# Patient Record
Sex: Female | Born: 1965 | Race: Black or African American | Hispanic: No | Marital: Single | Smoking: Never smoker
Health system: Southern US, Community
[De-identification: ages and names within clinical notes are randomized; demographics above are authoritative.]

## PROBLEM LIST (undated history)

## (undated) ENCOUNTER — Ambulatory Visit (HOSPITAL_COMMUNITY): Admission: EM | Payer: 59 | Source: Home / Self Care

## (undated) DIAGNOSIS — I10 Essential (primary) hypertension: Secondary | ICD-10-CM

## (undated) DIAGNOSIS — R011 Cardiac murmur, unspecified: Secondary | ICD-10-CM

## (undated) DIAGNOSIS — I4892 Unspecified atrial flutter: Secondary | ICD-10-CM

## (undated) DIAGNOSIS — I4891 Unspecified atrial fibrillation: Secondary | ICD-10-CM

## (undated) HISTORY — DX: Unspecified atrial flutter: I48.92

## (undated) HISTORY — DX: Essential (primary) hypertension: I10

---

## 2001-09-11 ENCOUNTER — Emergency Department (HOSPITAL_COMMUNITY): Admission: EM | Admit: 2001-09-11 | Discharge: 2001-09-11 | Payer: Self-pay

## 2003-06-06 ENCOUNTER — Encounter: Admission: RE | Admit: 2003-06-06 | Discharge: 2003-06-06 | Payer: Self-pay | Admitting: Ophthalmology

## 2003-06-12 ENCOUNTER — Encounter (HOSPITAL_COMMUNITY): Admission: RE | Admit: 2003-06-12 | Discharge: 2003-09-10 | Payer: Self-pay | Admitting: Internal Medicine

## 2005-04-29 ENCOUNTER — Encounter: Admission: RE | Admit: 2005-04-29 | Discharge: 2005-04-29 | Payer: Self-pay | Admitting: Obstetrics and Gynecology

## 2005-05-06 ENCOUNTER — Other Ambulatory Visit: Admission: RE | Admit: 2005-05-06 | Discharge: 2005-05-06 | Payer: Self-pay | Admitting: Obstetrics and Gynecology

## 2006-05-17 ENCOUNTER — Emergency Department (HOSPITAL_COMMUNITY): Admission: EM | Admit: 2006-05-17 | Discharge: 2006-05-17 | Payer: Self-pay | Admitting: *Deleted

## 2009-01-16 ENCOUNTER — Emergency Department (HOSPITAL_COMMUNITY): Admission: EM | Admit: 2009-01-16 | Discharge: 2009-01-16 | Payer: Self-pay | Admitting: Family Medicine

## 2009-01-28 ENCOUNTER — Emergency Department (HOSPITAL_COMMUNITY): Admission: EM | Admit: 2009-01-28 | Discharge: 2009-01-28 | Payer: Self-pay | Admitting: Family Medicine

## 2009-10-14 ENCOUNTER — Emergency Department (HOSPITAL_COMMUNITY): Admission: EM | Admit: 2009-10-14 | Discharge: 2009-10-14 | Payer: Self-pay | Admitting: Emergency Medicine

## 2012-01-14 ENCOUNTER — Encounter (HOSPITAL_COMMUNITY): Payer: Self-pay | Admitting: *Deleted

## 2012-01-14 ENCOUNTER — Emergency Department (INDEPENDENT_AMBULATORY_CARE_PROVIDER_SITE_OTHER)
Admission: EM | Admit: 2012-01-14 | Discharge: 2012-01-14 | Disposition: A | Discharge status: Home / Self Care | Payer: 59 | Source: Home / Self Care | Attending: Family Medicine | Admitting: Family Medicine

## 2012-01-14 DIAGNOSIS — R05 Cough: Secondary | ICD-10-CM

## 2012-01-14 DIAGNOSIS — J069 Acute upper respiratory infection, unspecified: Secondary | ICD-10-CM

## 2012-01-14 DIAGNOSIS — R059 Cough, unspecified: Secondary | ICD-10-CM

## 2012-01-14 LAB — POCT RAPID STREP A: Streptococcus, Group A Screen (Direct): NEGATIVE

## 2012-01-14 MED ORDER — AMOXICILLIN 500 MG PO CAPS: 500.0000 mg | ORAL_CAPSULE | Freq: Three times a day (TID) | ORAL | Status: DC

## 2012-01-14 MED ORDER — IBUPROFEN 600 MG PO TABS: 600.0000 mg | ORAL_TABLET | Freq: Three times a day (TID) | ORAL | Status: DC | PRN

## 2012-01-14 MED ORDER — HYDROCOD POLST-CHLORPHEN POLST 10-8 MG/5ML PO LQCR: 5.0000 mL | Freq: Two times a day (BID) | ORAL | Status: DC | PRN

## 2012-01-14 MED ORDER — BENZONATATE 100 MG PO CAPS: 100.0000 mg | ORAL_CAPSULE | Freq: Three times a day (TID) | ORAL | Status: DC

## 2012-01-14 NOTE — ED Provider Notes (Signed)
History     CSN: [id number]  Arrival date & time 01/14/12  1020   First MD Initiated Contact with Patient 01/14/12 1032      Chief Complaint  Patient presents with  . Sore Throat    (Consider location/radiation/quality/duration/timing/severity/associated sxs/prior treatment) HPI Comments: 46 year old female with no significant past medical history here complaining of sore throat, hoarseness, persistent dry cough and nasal congestion for 3 days. Patient reports upper anterior chest pain discomfort with coughing. She reports that her cough is worst at nighttime and is not associated with sputum. Also denies wheezing or shortness of breath. Denies pleuritic type of chest pain. Patient denies fever, chills or rigors. Reports pain with swallowing but states her appetite is at baseline. Denies abdominal pain or rash. No headache or dizziness. Patient reports that she feels tired from coughing. 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   History reviewed. No pertinent past medical history.  History reviewed. No pertinent past surgical history.  No family history on file.  History  Substance Use Topics  . Smoking status: Never Smoker   . Smokeless tobacco: Not on file  . Alcohol Use: No    OB History    Grav Para Term Preterm Abortions TAB SAB Ect Mult Living                  Review of Systems  Constitutional: Negative for fever, chills, diaphoresis and appetite change.  HENT: Positive for congestion and sore throat. Negative for facial swelling, rhinorrhea, neck pain, neck stiffness and sinus pressure.   Eyes: Negative for discharge.  Respiratory: Positive for cough. Negative for shortness of breath and wheezing.   Cardiovascular: Negative for leg swelling.  Gastrointestinal: Negative for nausea, vomiting, abdominal pain and diarrhea.  Musculoskeletal: Negative for myalgias, back pain, joint swelling and arthralgias.  Skin: Negative for rash.  Neurological: Negative for  dizziness and headaches.    Allergies  Review of patient's allergies indicates no known allergies.  Home Medications   Current Outpatient Rx  Name Route Sig Dispense Refill  . AMOXICILLIN 500 MG PO CAPS Oral Take 1 capsule (500 mg total) by mouth 3 (three) times daily. Hold prescription: Only fill if new onset of fever or worsening symptoms after 48 hours. 21 capsule 0  . BENZONATATE 100 MG PO CAPS Oral Take 1 capsule (100 mg total) by mouth every 8 (eight) hours. 21 capsule 0  . HYDROCOD POLST-CPM POLST ER 10-8 MG/5ML PO LQCR Oral Take 5 mLs by mouth every 12 (twelve) hours as needed. 115 mL 0  . IBUPROFEN 600 MG PO TABS Oral Take 1 tablet (600 mg total) by mouth every 8 (eight) hours as needed for pain or fever. 20 tablet 0    BP 147/88  Pulse 96  Temp 98 F (36.7 C) (Oral)  Resp 20  SpO2 100%  LMP 01/12/2012  Physical Exam  Nursing note and vitals reviewed. Constitutional: She is oriented to person, place, and time. She appears well-developed and well-nourished. No distress.  HENT:  Head: Normocephalic and atraumatic.       Nasal Congestion with erythema and swelling of nasal turbinates, no rhinorrhea. Pharyngeal erythema no exudates. No uvula deviation. No trismus. TM's normal.   Eyes: Conjunctivae normal are normal. Right eye exhibits no discharge. Left eye exhibits no discharge.  Neck: Neck supple. No JVD present. No thyromegaly present.  Cardiovascular: Normal rate, regular rhythm and normal heart sounds.   No murmur heard. Pulmonary/Chest: Effort normal and breath sounds normal. No  respiratory distress. She has no wheezes. She has no rales. She exhibits no tenderness.  Lymphadenopathy:    She has no cervical adenopathy.  Neurological: She is alert and oriented to person, place, and time.  Skin: No rash noted. She is not diaphoretic.    ED Course  Procedures (including critical care time)   Labs Reviewed  POCT RAPID STREP A ([facility] ONLY)   No results  found.   1. URI (upper respiratory infection)   2. Cough       MDM  Negative rapid strep test. Clinical findings and history suggestive of a viral upper respiratory infection. Supportive care discussed with patient and provided in writing. Provided a hold prescription for amoxicillin only to fill if persistent or worsening symptoms after 48 hours. Red flags that should prompt her return to medical attention discussed with patient and provided in writing.  Sharin Grave, MD 01/15/12 1230

## 2012-01-14 NOTE — ED Notes (Signed)
Pt  Reports   Symptoms  Of     sorethroat          Cough  /  Congested      Hoarseness     Pt  Reports  Symptoms  Not  releived  By otc  meds

## 2013-02-08 ENCOUNTER — Ambulatory Visit: Payer: PRIVATE HEALTH INSURANCE | Attending: Occupational Medicine | Admitting: Occupational Therapy

## 2013-02-08 DIAGNOSIS — M79609 Pain in unspecified limb: Secondary | ICD-10-CM | POA: Insufficient documentation

## 2013-02-08 DIAGNOSIS — M6281 Muscle weakness (generalized): Secondary | ICD-10-CM | POA: Insufficient documentation

## 2013-02-08 DIAGNOSIS — IMO0001 Reserved for inherently not codable concepts without codable children: Secondary | ICD-10-CM | POA: Insufficient documentation

## 2013-02-08 DIAGNOSIS — M25539 Pain in unspecified wrist: Secondary | ICD-10-CM | POA: Insufficient documentation

## 2013-02-13 ENCOUNTER — Ambulatory Visit: Payer: PRIVATE HEALTH INSURANCE | Attending: Orthopedic Surgery | Admitting: Occupational Therapy

## 2013-02-13 DIAGNOSIS — M79609 Pain in unspecified limb: Secondary | ICD-10-CM | POA: Insufficient documentation

## 2013-02-13 DIAGNOSIS — M6281 Muscle weakness (generalized): Secondary | ICD-10-CM | POA: Insufficient documentation

## 2013-02-13 DIAGNOSIS — M25539 Pain in unspecified wrist: Secondary | ICD-10-CM | POA: Insufficient documentation

## 2013-02-13 DIAGNOSIS — IMO0001 Reserved for inherently not codable concepts without codable children: Secondary | ICD-10-CM | POA: Insufficient documentation

## 2013-02-15 ENCOUNTER — Ambulatory Visit: Payer: PRIVATE HEALTH INSURANCE | Admitting: Occupational Therapy

## 2013-02-20 ENCOUNTER — Ambulatory Visit: Payer: PRIVATE HEALTH INSURANCE | Attending: Orthopedic Surgery | Admitting: Occupational Therapy

## 2013-02-20 DIAGNOSIS — M79609 Pain in unspecified limb: Secondary | ICD-10-CM | POA: Insufficient documentation

## 2013-02-20 DIAGNOSIS — IMO0001 Reserved for inherently not codable concepts without codable children: Secondary | ICD-10-CM | POA: Insufficient documentation

## 2013-02-20 DIAGNOSIS — M25539 Pain in unspecified wrist: Secondary | ICD-10-CM | POA: Insufficient documentation

## 2013-02-20 DIAGNOSIS — M6281 Muscle weakness (generalized): Secondary | ICD-10-CM | POA: Insufficient documentation

## 2013-02-22 ENCOUNTER — Ambulatory Visit: Payer: PRIVATE HEALTH INSURANCE | Attending: Occupational Therapy | Admitting: Occupational Therapy

## 2013-02-22 DIAGNOSIS — M6281 Muscle weakness (generalized): Secondary | ICD-10-CM | POA: Insufficient documentation

## 2013-02-22 DIAGNOSIS — M25539 Pain in unspecified wrist: Secondary | ICD-10-CM | POA: Insufficient documentation

## 2013-02-22 DIAGNOSIS — M79609 Pain in unspecified limb: Secondary | ICD-10-CM | POA: Insufficient documentation

## 2013-02-22 DIAGNOSIS — IMO0001 Reserved for inherently not codable concepts without codable children: Secondary | ICD-10-CM | POA: Insufficient documentation

## 2013-02-28 ENCOUNTER — Ambulatory Visit: Payer: PRIVATE HEALTH INSURANCE | Admitting: Occupational Therapy

## 2013-03-03 ENCOUNTER — Ambulatory Visit: Payer: PRIVATE HEALTH INSURANCE | Attending: Orthopedic Surgery | Admitting: Occupational Therapy

## 2013-03-03 DIAGNOSIS — M6281 Muscle weakness (generalized): Secondary | ICD-10-CM | POA: Insufficient documentation

## 2013-03-03 DIAGNOSIS — IMO0001 Reserved for inherently not codable concepts without codable children: Secondary | ICD-10-CM | POA: Insufficient documentation

## 2013-03-03 DIAGNOSIS — M79609 Pain in unspecified limb: Secondary | ICD-10-CM | POA: Insufficient documentation

## 2013-03-03 DIAGNOSIS — M25539 Pain in unspecified wrist: Secondary | ICD-10-CM | POA: Insufficient documentation

## 2013-03-06 ENCOUNTER — Ambulatory Visit: Payer: PRIVATE HEALTH INSURANCE | Attending: Orthopedic Surgery | Admitting: Occupational Therapy

## 2013-03-06 DIAGNOSIS — M25539 Pain in unspecified wrist: Secondary | ICD-10-CM | POA: Insufficient documentation

## 2013-03-06 DIAGNOSIS — M79609 Pain in unspecified limb: Secondary | ICD-10-CM | POA: Insufficient documentation

## 2013-03-06 DIAGNOSIS — IMO0001 Reserved for inherently not codable concepts without codable children: Secondary | ICD-10-CM | POA: Insufficient documentation

## 2013-03-06 DIAGNOSIS — M6281 Muscle weakness (generalized): Secondary | ICD-10-CM | POA: Insufficient documentation

## 2013-03-08 ENCOUNTER — Ambulatory Visit: Payer: PRIVATE HEALTH INSURANCE | Attending: Orthopedic Surgery | Admitting: Occupational Therapy

## 2013-03-08 DIAGNOSIS — IMO0001 Reserved for inherently not codable concepts without codable children: Secondary | ICD-10-CM | POA: Insufficient documentation

## 2013-03-08 DIAGNOSIS — M79609 Pain in unspecified limb: Secondary | ICD-10-CM | POA: Insufficient documentation

## 2013-03-08 DIAGNOSIS — M6281 Muscle weakness (generalized): Secondary | ICD-10-CM | POA: Insufficient documentation

## 2013-03-08 DIAGNOSIS — M25539 Pain in unspecified wrist: Secondary | ICD-10-CM | POA: Insufficient documentation

## 2013-03-13 ENCOUNTER — Encounter: Payer: Self-pay | Admitting: Occupational Therapy

## 2013-03-21 ENCOUNTER — Ambulatory Visit: Payer: Self-pay | Admitting: Occupational Therapy

## 2013-03-28 ENCOUNTER — Ambulatory Visit: Payer: Self-pay | Attending: Occupational Therapy | Admitting: Occupational Therapy

## 2013-03-30 ENCOUNTER — Ambulatory Visit: Payer: Self-pay | Admitting: Occupational Therapy

## 2016-02-05 ENCOUNTER — Ambulatory Visit (HOSPITAL_COMMUNITY): Payer: Managed Care, Other (non HMO)

## 2016-02-05 ENCOUNTER — Ambulatory Visit (HOSPITAL_COMMUNITY)
Admission: EM | Admit: 2016-02-05 | Discharge: 2016-02-05 | Disposition: A | Discharge status: Home / Self Care | Payer: Managed Care, Other (non HMO) | Attending: Family Medicine | Admitting: Family Medicine

## 2016-02-05 ENCOUNTER — Encounter (HOSPITAL_COMMUNITY): Payer: Self-pay | Admitting: Emergency Medicine

## 2016-02-05 ENCOUNTER — Ambulatory Visit (INDEPENDENT_AMBULATORY_CARE_PROVIDER_SITE_OTHER): Payer: Managed Care, Other (non HMO)

## 2016-02-05 DIAGNOSIS — S92421A Displaced fracture of distal phalanx of right great toe, initial encounter for closed fracture: Secondary | ICD-10-CM

## 2016-02-05 MED ORDER — HYDROCODONE-ACETAMINOPHEN 5-325 MG PO TABS: 1.0000 | ORAL_TABLET | Freq: Four times a day (QID) | ORAL | 1 refills | Status: DC | PRN

## 2016-02-05 NOTE — ED Provider Notes (Signed)
[facility]    CSN: [id number] Arrival date & time: 02/05/16  1603     History   Chief Complaint Chief Complaint  Patient presents with  . Toe Pain    HPI Tina Robinson is a 50 y.o. female.   The history is provided by the patient.  Toe Pain  This is a new problem. The current episode started 1 to 2 hours ago (jammed rt gt toe walking down step with distal injury.). The problem has not changed since onset.   History reviewed. No pertinent past medical history.  There are no active problems to display for this patient.   Past Surgical History:  Procedure Laterality Date  . CESAREAN SECTION      OB History    No data available       Home Medications    Prior to Admission medications   Medication Sig Start Date End Date Taking? Authorizing Provider  amoxicillin (AMOXIL) 500 MG capsule Take 1 capsule (500 mg total) by mouth 3 (three) times daily. Hold prescription: Only fill if new onset of fever or worsening symptoms after 48 hours. 01/14/12   Adlih Moreno-Coll, MD  benzonatate (TESSALON) 100 MG capsule Take 1 capsule (100 mg total) by mouth every 8 (eight) hours. 01/14/12   Adlih Moreno-Coll, MD  chlorpheniramine-HYDROcodone (TUSSIONEX) 10-8 MG/5ML LQCR Take 5 mLs by mouth every 12 (twelve) hours as needed. 01/14/12   Adlih Moreno-Coll, MD  ibuprofen (ADVIL,MOTRIN) 600 MG tablet Take 1 tablet (600 mg total) by mouth every 8 (eight) hours as needed for pain or fever. 01/14/12   Sharin GraveAdlih Moreno-Coll, MD    Family History History reviewed. No pertinent family history.  Social History Social History  Substance Use Topics  . Smoking status: Never Smoker  . Smokeless tobacco: Never Used  . Alcohol use No     Allergies   Patient has no known allergies.   Review of Systems Review of Systems  Constitutional: Negative.   Musculoskeletal: Positive for gait problem and joint swelling.  All other systems reviewed and are negative.    Physical  Exam Triage Vital Signs ED Triage Vitals  Enc Vitals Group     BP 02/05/16 1627 134/96     Pulse Rate 02/05/16 1627 105     Resp 02/05/16 1627 18     Temp 02/05/16 1627 98.5 F (36.9 C)     Temp Source 02/05/16 1627 Oral     SpO2 02/05/16 1627 95 %     Weight --      Height --      Head Circumference --      Peak Flow --      Pain Score 02/05/16 1630 10     Pain Loc --      Pain Edu? --      Excl. in GC? --    No data found.   Updated Vital Signs BP 134/96 (BP Location: Left Arm)   Pulse 105   Temp 98.5 F (36.9 C) (Oral)   Resp 18   LMP 01/12/2012   SpO2 95%   Visual Acuity Right Eye Distance:   Left Eye Distance:   Bilateral Distance:    Right Eye Near:   Left Eye Near:    Bilateral Near:     Physical Exam  Constitutional: She is oriented to person, place, and time. She appears well-developed and well-nourished. She appears distressed.  Musculoskeletal: She exhibits tenderness.       Right foot: There is  decreased range of motion, bony tenderness and swelling. There is no crepitus and no deformity.       Feet:  Neurological: She is alert and oriented to person, place, and time.  Skin: Skin is warm and dry.  Nursing note and vitals reviewed.    UC Treatments / Results  Labs (all labs ordered are listed, but only abnormal results are displayed) Labs Reviewed - No data to display  EKG  EKG Interpretation None       Radiology No results found.  Procedures Procedures (including critical care time)  Medications Ordered in UC Medications - No data to display   Initial Impression / Assessment and Plan / UC Course  I have reviewed the triage vital signs and the nursing notes.  Pertinent labs & imaging results that were available during my care of the patient were reviewed by me and considered in my medical decision making (see chart for details).  Clinical Course       Final Clinical Impressions(s) / UC Diagnoses   Final diagnoses:   None    New Prescriptions New Prescriptions   No medications on file     Linna HoffJames D Kindl, MD 02/05/16 [phone]

## 2016-02-05 NOTE — ED Triage Notes (Signed)
[facility] a [facility]. The patient reported that she was walking down some steps today and missed a step injuring the great toe on her right foot.

## 2016-02-05 NOTE — Discharge Instructions (Signed)
Ice, wood shoe and activity as tolerated, return as needed.

## 2016-11-04 ENCOUNTER — Encounter (HOSPITAL_COMMUNITY): Payer: Self-pay | Admitting: Emergency Medicine

## 2016-11-04 ENCOUNTER — Observation Stay (HOSPITAL_COMMUNITY)
Admission: EM | Admit: 2016-11-04 | Discharge: 2016-11-06 | Disposition: A | Discharge status: Home / Self Care | Payer: Managed Care, Other (non HMO) | Attending: Student in an Organized Health Care Education/Training Program | Admitting: Student in an Organized Health Care Education/Training Program

## 2016-11-04 ENCOUNTER — Emergency Department (HOSPITAL_COMMUNITY): Payer: Managed Care, Other (non HMO)

## 2016-11-04 DIAGNOSIS — I4892 Unspecified atrial flutter: Secondary | ICD-10-CM

## 2016-11-04 DIAGNOSIS — I4891 Unspecified atrial fibrillation: Secondary | ICD-10-CM

## 2016-11-04 DIAGNOSIS — I480 Paroxysmal atrial fibrillation: Secondary | ICD-10-CM | POA: Diagnosis present

## 2016-11-04 DIAGNOSIS — R079 Chest pain, unspecified: Principal | ICD-10-CM

## 2016-11-04 DIAGNOSIS — R252 Cramp and spasm: Secondary | ICD-10-CM | POA: Insufficient documentation

## 2016-11-04 DIAGNOSIS — I483 Typical atrial flutter: Secondary | ICD-10-CM

## 2016-11-04 HISTORY — DX: Unspecified atrial flutter: I48.92

## 2016-11-04 LAB — RAPID URINE DRUG SCREEN, HOSP PERFORMED
Amphetamines: NOT DETECTED
Barbiturates: NOT DETECTED
Benzodiazepines: NOT DETECTED
Cocaine: NOT DETECTED
Opiates: NOT DETECTED
Tetrahydrocannabinol: NOT DETECTED

## 2016-11-04 LAB — I-STAT TROPONIN, ED
Troponin i, poc: 0 ng/mL (ref 0.00–0.08)
Troponin i, poc: 0 ng/mL (ref 0.00–0.08)

## 2016-11-04 LAB — COMPREHENSIVE METABOLIC PANEL
ALT: 19 U/L (ref 14–54)
AST: 25 U/L (ref 15–41)
Albumin: 3.7 g/dL (ref 3.5–5.0)
Alkaline Phosphatase: 81 U/L (ref 38–126)
Anion gap: 11 (ref 5–15)
BUN: 12 mg/dL (ref 6–20)
CO2: 26 mmol/L (ref 22–32)
Calcium: 8.9 mg/dL (ref 8.9–10.3)
Chloride: 104 mmol/L (ref 101–111)
Creatinine, Ser: 0.87 mg/dL (ref 0.44–1.00)
GFR calc Af Amer: >60 mL/min (ref >60)
GFR calc non Af Amer: >60 mL/min (ref >60)
Glucose, Bld: 106 mg/dL — ABNORMAL HIGH (ref 65–99)
Potassium: 3.9 mmol/L (ref 3.5–5.1)
Sodium: 141 mmol/L (ref 135–145)
Total Bilirubin: 0.5 mg/dL (ref 0.3–1.2)
Total Protein: 7.4 g/dL (ref 6.5–8.1)

## 2016-11-04 LAB — CBC
HCT: 38.1 % (ref 36.0–46.0)
Hemoglobin: 12.5 g/dL (ref 12.0–15.0)
MCH: 28 pg (ref 26.0–34.0)
MCHC: 32.8 g/dL (ref 30.0–36.0)
MCV: 85.2 fL (ref 78.0–100.0)
Platelets: 332 10*3/uL (ref 150–400)
RBC: 4.47 MIL/uL (ref 3.87–5.11)
RDW: 13.3 % (ref 11.5–15.5)
WBC: 11.7 10*3/uL — ABNORMAL HIGH (ref 4.0–10.5)

## 2016-11-04 LAB — BASIC METABOLIC PANEL
Anion gap: 9 (ref 5–15)
BUN: 14 mg/dL (ref 6–20)
CO2: 27 mmol/L (ref 22–32)
Calcium: 9.1 mg/dL (ref 8.9–10.3)
Chloride: 104 mmol/L (ref 101–111)
Creatinine, Ser: 0.9 mg/dL (ref 0.44–1.00)
GFR calc Af Amer: >60 mL/min (ref >60)
GFR calc non Af Amer: >60 mL/min (ref >60)
Glucose, Bld: 145 mg/dL — ABNORMAL HIGH (ref 65–99)
Potassium: 3.7 mmol/L (ref 3.5–5.1)
Sodium: 140 mmol/L (ref 135–145)

## 2016-11-04 LAB — TROPONIN I
Troponin I: <0.03 ng/mL (ref <0.03)
Troponin I: <0.03 ng/mL (ref <0.03)
Troponin I: <0.03 ng/mL (ref <0.03)

## 2016-11-04 LAB — D-DIMER, QUANTITATIVE: D-Dimer, Quant: <0.27 ug/mL-FEU (ref 0.00–0.50)

## 2016-11-04 LAB — TSH: TSH: 2.152 u[IU]/mL (ref 0.350–4.500)

## 2016-11-04 LAB — MAGNESIUM: Magnesium: 2.2 mg/dL (ref 1.7–2.4)

## 2016-11-04 MED ORDER — RIVAROXABAN 20 MG PO TABS
20.0000 mg | ORAL_TABLET | Freq: Every day | ORAL | Status: DC
  Administered 2016-11-04 – 2016-11-05 (×2): 20 mg via ORAL
  Filled 2016-11-04 (×3): qty 1

## 2016-11-04 MED ORDER — OFF THE BEAT BOOK
Freq: Once | Status: AC
  Administered 2016-11-04: 19:00:00
  Filled 2016-11-04: qty 1

## 2016-11-04 MED ORDER — DILTIAZEM HCL 30 MG PO TABS
30.0000 mg | ORAL_TABLET | Freq: Four times a day (QID) | ORAL | Status: DC
  Administered 2016-11-04: 30 mg via ORAL
  Filled 2016-11-04 (×2): qty 1

## 2016-11-04 MED ORDER — ACETAMINOPHEN 325 MG PO TABS: 650.0000 mg | ORAL_TABLET | Freq: Four times a day (QID) | ORAL | Status: DC | PRN

## 2016-11-04 MED ORDER — DILTIAZEM LOAD VIA INFUSION
10.0000 mg | Freq: Once | INTRAVENOUS | Status: AC
  Administered 2016-11-04: 10 mg via INTRAVENOUS
  Filled 2016-11-04: qty 10

## 2016-11-04 MED ORDER — DILTIAZEM HCL 100 MG IV SOLR
5.0000 mg/h | INTRAVENOUS | Status: DC
  Administered 2016-11-04: 5 mg/h via INTRAVENOUS
  Filled 2016-11-04: qty 100

## 2016-11-04 MED ORDER — ACETAMINOPHEN 650 MG RE SUPP: 650.0000 mg | Freq: Four times a day (QID) | RECTAL | Status: DC | PRN

## 2016-11-04 MED ORDER — SODIUM CHLORIDE 0.9% FLUSH
3.0000 mL | Freq: Two times a day (BID) | INTRAVENOUS | Status: DC
  Administered 2016-11-04 – 2016-11-06 (×3): 3 mL via INTRAVENOUS

## 2016-11-04 MED ORDER — DILTIAZEM HCL 60 MG PO TABS
60.0000 mg | ORAL_TABLET | Freq: Four times a day (QID) | ORAL | Status: DC
  Administered 2016-11-04 – 2016-11-06 (×7): 60 mg via ORAL
  Filled 2016-11-04 (×7): qty 1

## 2016-11-04 MED ORDER — ASPIRIN 81 MG PO CHEW
324.0000 mg | CHEWABLE_TABLET | Freq: Once | ORAL | Status: AC
  Administered 2016-11-04: 324 mg via ORAL
  Filled 2016-11-04: qty 4

## 2016-11-04 MED ORDER — FLECAINIDE ACETATE 100 MG PO TABS
300.0000 mg | ORAL_TABLET | Freq: Once | ORAL | Status: AC
  Administered 2016-11-04: 300 mg via ORAL
  Filled 2016-11-04: qty 3

## 2016-11-04 NOTE — ED Triage Notes (Addendum)
Patient reports central chest tightness /"indigestion" with SOB and palpitations onset yesterday , denies emesis or diaphoresis . HR = 160's at triage .

## 2016-11-04 NOTE — ED Notes (Signed)
Attempted report 

## 2016-11-04 NOTE — ED Notes (Signed)
Patient wheeled to the room and being hooked up by EMT to cardiac monitor.

## 2016-11-04 NOTE — Consult Note (Signed)
Cardiology Consultation:   Patient ID: Tina Robinson; [id number]; 05/22/1965   Admit date: 11/04/2016 Date of Consult: 11/04/2016  Primary Care Provider: Patient, No Pcp Per Primary Cardiologist: New (Dr. Royann Shivers)  Primary Electrophysiologist:  N/A   Patient Profile:   Tina Robinson is a 51 y.o. female no significant PMH, who is being seen today for the evaluation of new onset atrial flutter, at the request of Dr. Oswaldo Done, Internal Medicine.  History of Present Illness:   As outlined above, she has no significant PMH. No prior h/o cardiac disease. No pertinent family history. No risk factors. She denies h/o HTN, HLD, DM or tobacco use. No prior h/o stroke or TIA. She works as an Database administrator.  She presented to the ED overnight with complaint of palpitations and chest discomfort, which started yesterday on 11/03/16. She was in her usual state of health until yesterday morning. She woke up with a severe HA and took Tylenol which improved the pain. She went to work and felt ok. After work, she attended a pharmaceutical drug dinner at [employer]. She ate a steak but denies caffeine or ETOH with her meal. After leaving around 8 PM, she developed substernal chest discomfort, which felt like indigestion. She also had associated right arm pain. 2 hr later, she developed tachy palpitations at rest. She has never felt this before. She laid down but continued to have symptoms, prompting her to come to the ED.   On arrival to the ED, EKG showed atrial flutter with ventricular rate in the 160s. BMP unremarkable with K at 3.9, SCr at 0.87 and BUN at 12. Troponin is negative x 1. Her CP resolved spontaneously. CBC with slightly abnormal WBC at 11.7, however she is afebrile. CXR w/o edema or infiltrate. No UA ordered but she denies urinary symptoms. TSH is WNL. Mg also normal at 2.2.   Per records, DCCV was offered however the patient was apprehensive and initially declined. She was then offered  Flecainide and accepted. She was given a 1x dose of 300 mg along with PO Cardizem, however no conversion. She remains in atrial flutter with improvement in Vrates, now fluctuating in the 90s-120s. BP is stable. She is comfortable at rest. No recurrent CP.   History reviewed. No pertinent past medical history.  Past Surgical History:  Procedure Laterality Date  . CESAREAN SECTION       Inpatient Medications: Scheduled Meds: . diltiazem  30 mg Oral Q6H  . rivaroxaban  20 mg Oral Q supper  . sodium chloride flush  3 mL Intravenous Q12H   Continuous Infusions:  PRN Meds: acetaminophen **OR** acetaminophen  Allergies:    Allergies  Allergen Reactions  . Mushroom Extract Complex Anaphylaxis    Social History:   Social History   Social History  . Marital status: Single    Spouse name: N/A  . Number of children: N/A  . Years of education: N/A   Occupational History  . Not on file.   Social History Main Topics  . Smoking status: Never Smoker  . Smokeless tobacco: Never Used  . Alcohol use No  . Drug use: No  . Sexual activity: Not on file   Other Topics Concern  . Not on file   Social History Narrative  . No narrative on file    Family History:    Family History  Problem Relation Age of Onset  . Peripheral Artery Disease Mother   . Hypertension Mother      ROS:  Please see the history of present illness.  Review of Systems  Cardiovascular: Positive for chest pain and palpitations. Negative for leg swelling, near-syncope and syncope.    All other ROS reviewed and negative.     Physical Exam/Data:   Vitals:   11/04/16 0900 11/04/16 1030 11/04/16 1130 11/04/16 1200  BP: (!) 153/96 115/78 132/87 (!) 150/94  Pulse: 63 86 91 99  Resp: 16 16    Temp:      TempSrc:      SpO2: 99% 98% 100% 100%  Weight:      Height:       No intake or output data in the 24 hours ending 11/04/16 1357 Filed Weights   11/04/16 0122  Weight: 230 lb (104.3 kg)   Body mass  index is 35.49 kg/m.  General:  Well nourished, well developed, in no acute distress HEENT: normal Lymph: no adenopathy Neck: no JVD Endocrine:  No thryomegaly Vascular: No carotid bruits; FA pulses 2+ bilaterally without bruits  Cardiac:  normal S1, S2; irregular rhythm, tachy rate; no murmur  Lungs:  clear to auscultation bilaterally, no wheezing, rhonchi or rales  Abd: soft, nontender, no hepatomegaly  Ext: no edema Musculoskeletal:  No deformities, BUE and BLE strength normal and equal Skin: warm and dry  Neuro:  CNs 2-12 intact, no focal abnormalities noted Psych:  Normal affect   EKG:  The EKG was personally reviewed and demonstrates:  Atrial Flutter 160 bpm Telemetry:  Telemetry was personally reviewed and demonstrates:  Atrial flutter/fibrillation w/ variable rates, currently in the 110s.  Relevant CV Studies: 2D Echo pending   Laboratory Data:  Chemistry Recent Labs Lab 11/04/16 0138 11/04/16 0825  NA 140 141  K 3.7 3.9  CL 104 104  CO2 27 26  GLUCOSE 145* 106*  BUN 14 12  CREATININE 0.90 0.87  CALCIUM 9.1 8.9  GFRNONAA >60 >60  GFRAA >60 >60  ANIONGAP 9 11     Recent Labs Lab 11/04/16 0825  PROT 7.4  ALBUMIN 3.7  AST 25  ALT 19  ALKPHOS 81  BILITOT 0.5   Hematology Recent Labs Lab 11/04/16 0138  WBC 11.7*  RBC 4.47  HGB 12.5  HCT 38.1  MCV 85.2  MCH 28.0  MCHC 32.8  RDW 13.3  PLT 332   Cardiac Enzymes Recent Labs Lab 11/04/16 0825  TROPONINI <0.03    Recent Labs Lab 11/04/16 0142 11/04/16 0423  TROPIPOC 0.00 0.00    BNPNo results for input(s): BNP, PROBNP in the last 168 hours.  DDimer  Recent Labs Lab 11/04/16 0640  DDIMER <0.27    Radiology/Studies:  Dg Chest Port 1 View  Result Date: 11/04/2016 CLINICAL DATA:  Chest pain and dizziness EXAM: PORTABLE CHEST 1 VIEW COMPARISON:  None. FINDINGS: Borderline cardiomegaly. Low lung volume. No consolidation or effusion. No pneumothorax. IMPRESSION: Low lung volumes with  borderline cardiomegaly. No edema or infiltrate. Electronically Signed   By: Jasmine PangKim  Fujinaga M.D.   On: 11/04/2016 02:23    Assessment and Plan:   1. New Onset Atrial Flutter/Fibrillation: TSH, Mg, K, H/H, renal function and d-dimer normal. Slightly elevated WBC however pt is afebrile and CXR negative for infiltrate. Consider checking UA. We will obtain a 2D Echo to assess LVF, screen for valvular disease, WMA and assess the pericardium. Continue to cycle cardiac enzymes x 3. Continue Cardizem for rate control. Flecainide 300 mg was tried earlier x 1 dose but no conversion. Plan is for IM to admit. Continue to  monitor on telemetry and continue cardizem for rate control. If no spontaneous conversion, then we can consider TEE/DCCV on Friday. We will put her on the schedule. We will bump her Cardizem up to 60 mg Q6H. Xarelto has been ordered, however we will place consult for case management to do cost comparison between DOACs, to see which would be most affordable based on her insurance, as adorability impacts compliance. Based on PMH, her CHA2DS2 VASc score is only 1 for female, however her BP here in the ED has been moderately elevated, although patient has no prior diagnosis of HTN. We will continue anticoagulation for now. We will also need to consider inpatient ischemic testing given chest and right arm pain. Pt should also be set up for outpatient sleep study, given her body habitus and h/o snoring and waking up from sleep short of breath (last occurence of this was ~ 1 month ago).  MD to assess and will provide further recs.     Signed, Robbie Lis, PA-C  11/04/2016 1:57 PM   I have seen and examined the patient along with Robbie Lis, PA-C.  I have reviewed the chart, notes and new data.  I agree with PA/NP's note.  Key new complaints: unaware of palpitations, but had mild chest discomfort and dyspnea with RVR, now asymptomatic at rest with ventricular rate 125. Symptoms for >48 h at  presentation. Has some symptoms worrisome for RVR. CHADSVasc 1 (gender only). Transient hypertension appears to be situational. Key examination changes: obese, rapid irregular heart rhythm, otherwise normal exam Key new findings / data: most of the telemetry strips and ECGs suggest typical atrial flutter, but at least at times the rhythm appears to be more disorganized, c/w atrial fibrillation. Labs, including TSH are normal.    PLAN: Rate control was satisfactory on IV diltiazem, has deteriorated after transition to PO meds. Increase the diltiazem dose. On oral anticoagulants. Echo pending. Discussed conservative approach (rate control and anticoagulation for 3-4 weeks, then DCCV as outpatient if still in abnormal rhythm) versus a more aggressive approach (TEE and DCCV after at least 3 doses of anticoagulant (on Friday per current schedule). At this point she appears more inclined to pursue the conservative approach. Either way I would consider it safest to use DOAC for at least another month after CV. Long-term, the risk of CVA and bleeding with DOAC are essentially equal and the benfit of anticoagulation is debatable. If we identify structural abnormalities on echo (e.g marked atrial dilation), this may sway the decision towards long term anticoagulation. Needs an OP sleep study.  Thurmon Fair, MD, [facility] [phone] 11/04/2016, 4:40 PM

## 2016-11-04 NOTE — H&P (Signed)
Date: 11/04/2016               Patient Name:  Tina Robinson MRN: [id number]  DOB: Jul 19, 1965 Age / Sex: 51 y.o., female   PCP: Patient, No Pcp Per         Medical Service: Internal Medicine Teaching Service         Attending Physician: Dr. Oswaldo DoneVincent, Marquita Palmsuncan Thomas, *    First Contact: Dr. Anthonette LegatoHarden Pager: [phone]  Second Contact: Dr. Earlene PlaterWallace Pager: [phone]       After Hours (After 5p/  First Contact Pager: [phone]  weekends / holidays): Second Contact Pager: [phone]   Chief Complaint: Chest heaviness  History of Present Illness:  Ms. Tina HaberCarol Robinson is a 51 year old woman without known significant PMH who presents to the ED with a sensation of chest heaviness and palpitations. Patient states she was in her usual state of health when she woke up yesterday morning with a mild headache. She took Tylenol and went to work. Went she returned home, she laid down to rest and thinks she fell asleep. She then woke from sleep with a feeling of chest heaviness at her sternum which she initially thought was heartburn. She stood up and felt lightheaded and noticed her heart was racing. She had some associated shortness of breath. She says she did not fall or lose consciousness. Her chest discomfort did not radiate.   She did not have associated diaphoresis, nausea, vomiting, numbness, tingling, or focal weakness. She reports having some diarrhea the last 2 days but no other obvious illness. Her only other complaint is bilateral leg cramping pain at night which does not occur while she is on her feet at work. She is not aware of any other medical conditions. She says her only medications are a multivitamin, probiotic, and Vitamin D supplement. She denies any similar episodes in the past, history of blood clots, and is unaware of any history of arrhythmia in her family. Her sister is present and thinks their other sister had been diagnosed with a blood clot. She presented to the ED for further  evaluation.  In the ED, initial vitals were: BP 142/94, Pulse 155, Temp 98.3, RR 16, SpO2 99% on RA. Initial EKG showed tachycardia with rate 161 and she was suspected to be in new onset atrial tachycardia. She was given IV Diltiazem without conversion. The ED physician offered cardioversion, however patient was reluctant to proceed. The ED physician then spoke to the on call Cardiology fellow who recommended a one time dose of Flecainide which she did not respond to.  Lab work in the ED was notable for a negative point-of-care troponin x 2 and negative D-dimer. WBC was 11.7, otherwise CBC and BMP were largely within normal limits. She received ASA 324 mg once and IMTS were called to admit for rate control and ACS rule out.  Meds:  Current Meds  Medication Sig  . Cholecalciferol (VITAMIN D PO) Take 1 tablet by mouth daily.  . Multiple Vitamin (MULTIVITAMIN WITH MINERALS) TABS tablet Take 1 tablet by mouth daily.     Allergies: Allergies as of 11/04/2016 - Review Complete 11/04/2016  Allergen Reaction Noted  . Mushroom extract complex Anaphylaxis 11/04/2016   History reviewed. No pertinent past medical history.  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 Her son lives with her. She  denies ever using tobacco, alcohol, or illicit drugs.  Review of Systems: A complete ROS was negative except as per HPI.   Physical Exam: Blood pressure (!) 153/96, pulse 63, temperature 98.3 F (36.8 C), temperature source Oral, resp. rate 16, height 5' 7.5" (1.715 m), weight 230 lb (104.3 kg), last menstrual period 01/12/2012, SpO2 99 %. Physical Exam  Constitutional: She is oriented to person, place, and time. She appears well-developed and well-nourished. No distress.  HENT:  Head: Normocephalic and atraumatic.  Mouth/Throat: Oropharynx is clear and moist. No  oropharyngeal exudate.  Eyes: Pupils are equal, round, and reactive to light. EOM are normal. No scleral icterus.  Neck: Normal range of motion.  Cardiovascular: Exam reveals no friction rub.   No murmur heard. Pulses:      Radial pulses are 2+ on the right side, and 2+ on the left side.       Dorsalis pedis pulses are 2+ on the right side, and 2+ on the left side.  Tachycardic, rhythm regular on auscultation  Pulmonary/Chest: Effort normal. No respiratory distress. She has no wheezes. She has no rales. She exhibits no tenderness.  Abdominal: Soft. Bowel sounds are normal. She exhibits no distension. There is no tenderness. There is no guarding.  Musculoskeletal: Normal range of motion. She exhibits no edema or tenderness.  No calf tenderness, leg swelling, or erythema. Strength 5/5 all extremities.   Neurological: She is alert and oriented to person, place, and time.  Skin: Skin is warm. She is not diaphoretic.  Psychiatric: She has a normal mood and affect.     EKG: personally reviewed my interpretation is: Initial EKG: tachycardia rate 160 bpm, normal R wave progression Repeat EKG: Atrial flutter 4:1 block, rate 78  CXR: personally reviewed my interpretation is low lung volume suggesting poor inspiration, no consolidation or effusion.  Assessment & Plan by Problem: Principal Problem:   Atrial flutter (HCC) Active Problems:   Leg cramping  Atrial Flutter: This appears to be new onset without obvious inciting factor. She has no known prior medical conditions although her prior BPs in our system have been mildly elevated and she is currently hypertensive. She was offered cardioversion in the ED, however was reluctant to proceed as she needed time to think and educate herself on the procedure. She is open to discussing the idea of cardioversion if appropriate now. Her CHA2DS2-VASc score is at least 1 (gender) with yearly stroke risk of 1.3%. If she is considered to have HTN then her  score is 2 with yearly stroke risk of 2.2% and indication for anticoagulation. Currently she is hemodynamically stable and her most recent heart rates have been <120 bpm. Will start oral diltiazem for rate control and Xarelto for anticoagulation. I have consulted Cardiology for further evaluation and possible DCCV if appropriate. - Cardiology consulted, appreciate recommendations and assistance - Diltiazem 30 mg po q6h - Xarelto 20 mg qhs - TTE - Check TSH, A1c, Lipid panel, UDS - Trend Troponins - Telemetry  Bilateral Leg Cramping: Has been occurring only at night when she goes to sleep. Possible restless leg syndrome. Symptoms currently seem to be mild. She has no cramping when on her feet at work which suggests this is less likely to be claudication. She does not have calf tenderness, leg swelling, erythema, or decreased pulses in her feet on exam. I am confident she is unlikely to have a DVT given her exam findings and negative D-dimer. - Continue to monitor, support behavioral strategies (exercise, etc.)  Dispo: Admit patient to Observation with expected length of stay less than 2 midnights.  Signed: Darreld Mclean, MD 11/04/2016, 9:26 AM

## 2016-11-04 NOTE — ED Notes (Signed)
X RAY at bedside 

## 2016-11-04 NOTE — ED Provider Notes (Signed)
[facility]-EMERGENCY DEPT Provider Note   CSN: [id number] Arrival date & time: 11/04/16  0114     History   Chief Complaint Chief Complaint  Patient presents with  . Chest Pain    HPI Tina Robinson is a 51 y.o. female.  The history is provided by the patient.  Palpitations   This is a new problem. The current episode started 6 to 12 hours ago. The problem occurs constantly. The problem has not changed since onset.Associated symptoms include headaches, dizziness and shortness of breath. Pertinent negatives include no fever, no vomiting, no cough and no hemoptysis. She has tried nothing for the symptoms. There are no known risk factors.  pt reports that over 6 hrs ago she had onset of fatigue/dizziness and palptiations.  She reports mild chest tightness.  She also reports mild right shoulder pain No syncope She also had mild HA throughout the day She also reports indigestion type symptoms as well She has never experienced this before   PMH - none Soc hx - nonsmoker  Past Surgical History:  Procedure Laterality Date  . CESAREAN SECTION      OB History    No data available       Home Medications    Prior to Admission medications   Medication Sig Start Date End Date Taking? Authorizing Provider  amoxicillin (AMOXIL) 500 MG capsule Take 1 capsule (500 mg total) by mouth 3 (three) times daily. Hold prescription: Only fill if new onset of fever or worsening symptoms after 48 hours. 01/14/12   Moreno-Coll, Adlih, MD  benzonatate (TESSALON) 100 MG capsule Take 1 capsule (100 mg total) by mouth every 8 (eight) hours. 01/14/12   Moreno-Coll, Adlih, MD  chlorpheniramine-HYDROcodone (TUSSIONEX) 10-8 MG/5ML LQCR Take 5 mLs by mouth every 12 (twelve) hours as needed. 01/14/12   Moreno-Coll, Adlih, MD  HYDROcodone-acetaminophen (NORCO/VICODIN) 5-325 MG tablet Take 1 tablet by mouth every 6 (six) hours as needed. 02/05/16   Linna Hoff, MD  ibuprofen (ADVIL,MOTRIN) 600 MG tablet Take 1  tablet (600 mg total) by mouth every 8 (eight) hours as needed for pain or fever. 01/14/12   Moreno-Coll, Adlih, MD    Family History No family history on file.  Social History Social History  Substance Use Topics  . Smoking status: Never Smoker  . Smokeless tobacco: Never Used  . Alcohol use No     Allergies   Patient has no known allergies.   Review of Systems Review of Systems  Constitutional: Negative for fever.  Respiratory: Positive for shortness of breath. Negative for cough and hemoptysis.   Cardiovascular: Positive for palpitations.  Gastrointestinal: Negative for vomiting.  Neurological: Positive for dizziness and headaches. Negative for syncope.  All other systems reviewed and are negative.    Physical Exam Updated Vital Signs BP (!) 142/94   Pulse (!) 154   Resp 16   Ht 1.715 m (5' 7.5")   Wt 104.3 kg (230 lb)   LMP 01/12/2012   SpO2 100%   BMI 35.49 kg/m   Physical Exam CONSTITUTIONAL: Well developed/well nourished HEAD: Normocephalic/atraumatic EYES: EOMI/PERRL ENMT: Mucous membranes moist NECK: supple no meningeal signs SPINE/BACK:entire spine nontender CV: tachycardic, no murmurs/rubs/gallops noted LUNGS: Lungs are clear to auscultation bilaterally, no apparent distress ABDOMEN: soft, nontender, no rebound or guarding, bowel sounds noted throughout abdomen GU:no cva tenderness NEURO: Pt is awake/alert/appropriate, moves all extremitiesx4.  No facial droop.   EXTREMITIES: pulses normal/equal, full ROM, no tenderness, no LE edema noted SKIN: warm, color normal  PSYCH: no abnormalities of mood noted, alert and oriented to situation   ED Treatments / Results  Labs (all labs ordered are listed, but only abnormal results are displayed) Labs Reviewed  BASIC METABOLIC PANEL - Abnormal; Notable for the following:       Result Value   Glucose, Bld 145 (*)    All other components within normal limits  CBC - Abnormal; Notable for the following:      WBC 11.7 (*)    All other components within normal limits  D-DIMER, QUANTITATIVE (NOT AT [facility])  I-STAT TROPONIN, ED  I-STAT TROPONIN, ED    EKG  EKG Interpretation  Date/Time:  Wednesday November 04 2016 01:20:29 EDT Ventricular Rate:  161 PR Interval:    QRS Duration: 86 QT Interval:  302 QTC Calculation: 494 R Axis:   18 Text Interpretation:  suspect atrial flutter Confirmed by Zadie Rhine ([id number]) on 11/04/2016 2:11:24 AM       EKG Interpretation  Date/Time:  Wednesday November 04 2016 02:29:58 EDT Ventricular Rate:  81 PR Interval:    QRS Duration: 112 QT Interval:  383 QTC Calculation: 445 R Axis:   20 Text Interpretation:  Atrial flutter with predominant 4:1 AV block Borderline intraventricular conduction delay Abnormal inferior Q waves Minimal ST elevation, inferior leads Confirmed by Zadie Rhine ([id number]) on 11/04/2016 3:19:20 AM       EKG Interpretation  Date/Time:  Wednesday November 04 2016 03:32:59 EDT Ventricular Rate:  89 PR Interval:    QRS Duration: 120 QT Interval:  380 QTC Calculation: 463 R Axis:   25 Text Interpretation:  Atrial fibrillation Nonspecific intraventricular conduction delay Borderline ST elevation, inferior leads Confirmed by Zadie Rhine ([id number]) on 11/04/2016 4:07:09 AM       EKG Interpretation  Date/Time:  Wednesday November 04 2016 07:02:47 EDT Ventricular Rate:  87 PR Interval:    QRS Duration: 127 QT Interval:  454 QTC Calculation: 524 R Axis:   39 Text Interpretation:  Atrial flutter Multiple ventricular premature complexes Nonspecific intraventricular conduction delay Abnormal ekg Interpretation limited secondary to artifact Confirmed by Zadie Rhine ([id number]) on 11/04/2016 7:12:51 AM        Radiology Dg Chest Port 1 View  Result Date: 11/04/2016 CLINICAL DATA:  Chest pain and dizziness EXAM: PORTABLE CHEST 1 VIEW COMPARISON:  None. FINDINGS: Borderline cardiomegaly. Low lung volume. No consolidation or  effusion. No pneumothorax. IMPRESSION: Low lung volumes with borderline cardiomegaly. No edema or infiltrate. Electronically Signed   By: Jasmine Pang M.D.   On: 11/04/2016 02:23    Procedures Procedures    Medications Ordered in ED Medications  diltiazem (CARDIZEM) 1 mg/mL load via infusion 10 mg (10 mg Intravenous Bolus from Bag 11/04/16 0224)  flecainide (TAMBOCOR) tablet 300 mg (300 mg Oral Given 11/04/16 0503)     Initial Impression / Assessment and Plan / ED Course  I have reviewed the triage vital signs and the nursing notes.  Pertinent labs & imaging results that were available during my care of the patient were reviewed by me and considered in my medical decision making (see chart for details).     2:26 AM Pt with tachycardia, suspect atrial flutter, new onset  This patients CHA2DS2-VASc Score and unadjusted Ischemic Stroke Rate (% per year) is equal to 0.6 % stroke rate/year from a score of 1  Above score calculated as 1 point each if present [CHF, HTN, DM, Vascular=MI/PAD/Aortic Plaque, Age if 65-74, or Female] Above score calculated as 2 points each  if present [Age > 75, or Stroke/TIA/TE]  3:19 AM After rate control, EKG shows obvious atrial flutter BP 125/89   Pulse 89   Temp 98.3 F (36.8 C) (Oral)   Resp 12   Ht 1.715 m (5' 7.5")   Wt 104.3 kg (230 lb)   LMP 01/12/2012   SpO2 100%   BMI 35.49 kg/m  Pt stable 4:07 AM Pt reluctant to perform cardioversion and does not want to proceed I spoke to on call cardiology Other option is one time dose of flecainide and monitor in ED Will order flecainide 7:32 AM No response to flecainide Pt still tachycardic, refusing cardioversion HEART score >3 Will need admission for rate control, she will need to start on anticoagulation and ACS rule out D/w resident for admission  Final Clinical Impressions(s) / ED Diagnoses   Final diagnoses:  Atrial flutter with rapid ventricular response (HCC)  Chest pain, rule out  acute myocardial infarction    New Prescriptions New Prescriptions   No medications on file     Zadie RhineWickline, Deklan Minar, MD 11/04/16 0745

## 2016-11-04 NOTE — ED Notes (Signed)
Cardiologist at bedside.  

## 2016-11-04 NOTE — ED Notes (Signed)
Patient and family member made this RN aware of pain that patient had been having in her legs last few weeks while trying to sleep.  They are worried about blood clots. MD aware.

## 2016-11-04 NOTE — ED Notes (Signed)
Meal tray ordered 

## 2016-11-04 NOTE — ED Notes (Signed)
Pt eating her breakfast tray.  

## 2016-11-04 NOTE — ED Notes (Signed)
Admitting at bedside 

## 2016-11-05 ENCOUNTER — Observation Stay (HOSPITAL_BASED_OUTPATIENT_CLINIC_OR_DEPARTMENT_OTHER): Payer: Managed Care, Other (non HMO)

## 2016-11-05 DIAGNOSIS — I4892 Unspecified atrial flutter: Secondary | ICD-10-CM | POA: Diagnosis not present

## 2016-11-05 DIAGNOSIS — R079 Chest pain, unspecified: Secondary | ICD-10-CM

## 2016-11-05 DIAGNOSIS — I483 Typical atrial flutter: Secondary | ICD-10-CM | POA: Diagnosis not present

## 2016-11-05 DIAGNOSIS — I4891 Unspecified atrial fibrillation: Secondary | ICD-10-CM | POA: Diagnosis not present

## 2016-11-05 DIAGNOSIS — R0602 Shortness of breath: Secondary | ICD-10-CM | POA: Diagnosis not present

## 2016-11-05 LAB — BASIC METABOLIC PANEL
Anion gap: 9 (ref 5–15)
BUN: 14 mg/dL (ref 6–20)
CO2: 24 mmol/L (ref 22–32)
Calcium: 8.6 mg/dL — ABNORMAL LOW (ref 8.9–10.3)
Chloride: 107 mmol/L (ref 101–111)
Creatinine, Ser: 0.95 mg/dL (ref 0.44–1.00)
GFR calc Af Amer: >60 mL/min (ref >60)
GFR calc non Af Amer: >60 mL/min (ref >60)
Glucose, Bld: 116 mg/dL — ABNORMAL HIGH (ref 65–99)
Potassium: 3.7 mmol/L (ref 3.5–5.1)
Sodium: 140 mmol/L (ref 135–145)

## 2016-11-05 LAB — CBC
HCT: 37 % (ref 36.0–46.0)
Hemoglobin: 12 g/dL (ref 12.0–15.0)
MCH: 27.8 pg (ref 26.0–34.0)
MCHC: 32.4 g/dL (ref 30.0–36.0)
MCV: 85.6 fL (ref 78.0–100.0)
Platelets: 296 10*3/uL (ref 150–400)
RBC: 4.32 MIL/uL (ref 3.87–5.11)
RDW: 13.5 % (ref 11.5–15.5)
WBC: 8.9 10*3/uL (ref 4.0–10.5)

## 2016-11-05 LAB — LIPID PANEL
Cholesterol: 159 mg/dL (ref 0–200)
HDL: 56 mg/dL (ref >40)
LDL Cholesterol: 90 mg/dL (ref 0–99)
Total CHOL/HDL Ratio: 2.8 RATIO
Triglycerides: 65 mg/dL (ref <150)
VLDL: 13 mg/dL (ref 0–40)

## 2016-11-05 LAB — HEMOGLOBIN A1C
Hgb A1c MFr Bld: 5.5 % (ref 4.8–5.6)
Mean Plasma Glucose: 111 mg/dL

## 2016-11-05 LAB — ECHOCARDIOGRAM COMPLETE
Height: 67.5 in
Weight: 3603.2 oz

## 2016-11-05 LAB — HIV ANTIBODY (ROUTINE TESTING W REFLEX): HIV Screen 4th Generation wRfx: NONREACTIVE

## 2016-11-05 MED ORDER — SODIUM CHLORIDE 0.9% FLUSH
3.0000 mL | Freq: Two times a day (BID) | INTRAVENOUS | Status: DC
  Administered 2016-11-05 – 2016-11-06 (×2): 3 mL via INTRAVENOUS

## 2016-11-05 MED ORDER — SODIUM CHLORIDE 0.9% FLUSH: 3.0000 mL | INTRAVENOUS | Status: DC | PRN

## 2016-11-05 MED ORDER — SODIUM CHLORIDE 0.9 % IV SOLN: 250.0000 mL | INTRAVENOUS | Status: DC

## 2016-11-05 NOTE — Progress Notes (Signed)
Progress Note  Patient Name: Tina Robinson Date of Encounter: 11/05/2016  Primary Cardiologist:  Dr. Royann Shivers  Subjective   Having palpitations and dizziness with minimal activity such as walking around her room or brushing her teeth. No recurrent chest pain.   Inpatient Medications    Scheduled Meds: . diltiazem  60 mg Oral Q6H  . rivaroxaban  20 mg Oral Q supper  . sodium chloride flush  3 mL Intravenous Q12H   Continuous Infusions:  PRN Meds: acetaminophen **OR** acetaminophen   Vital Signs    Vitals:   11/04/16 2111 11/05/16 0038 11/05/16 0444 11/05/16 0747  BP:  105/69 121/82 102/77  Pulse:  69 74 75  Resp:  16 16 18   Temp: 98.1 F (36.7 C)  98.4 F (36.9 C) 99 F (37.2 C)  TempSrc: Oral  Oral Oral  SpO2:  99% 99% 97%  Weight:   225 lb 3.2 oz (102.2 kg)   Height:       No intake or output data in the 24 hours ending 11/05/16 0938 Filed Weights   11/04/16 0122 11/04/16 1734 11/05/16 0444  Weight: 230 lb (104.3 kg) 230 lb (104.3 kg) 225 lb 3.2 oz (102.2 kg)    Telemetry    Atrial flutter, HR in 70's - 80's, peaking into the 140's with activity.  - Personally Reviewed  ECG    No new tracings.   Physical Exam   General: Well developed, overweight African American female appearing in no acute distress. Head: Normocephalic, atraumatic.  Neck: Supple without bruits, JVD not elevated. Lungs:  Resp regular and unlabored, CTA without wheezing or rales. Heart: Irregularly irregular, S1, S2, no S3, S4, or murmur; no rub. Abdomen: Soft, non-tender, non-distended with normoactive bowel sounds. No hepatomegaly. No rebound/guarding. No obvious abdominal masses. Extremities: No clubbing, cyanosis, or lower extremity edema. Distal pedal pulses are 2+ bilaterally. Neuro: Alert and oriented X 3. Moves all extremities spontaneously. Psych: Normal affect.  Labs    Chemistry Recent Labs Lab 11/04/16 0138 11/04/16 0825 11/05/16 0322  NA 140 141 140  K 3.7 3.9  3.7  CL 104 104 107  CO2 27 26 24   GLUCOSE 145* 106* 116*  BUN 14 12 14   CREATININE 0.90 0.87 0.95  CALCIUM 9.1 8.9 8.6*  PROT  --  7.4  --   ALBUMIN  --  3.7  --   AST  --  25  --   ALT  --  19  --   ALKPHOS  --  81  --   BILITOT  --  0.5  --   GFRNONAA >60 >60 >60  GFRAA >60 >60 >60  ANIONGAP 9 11 9      Hematology Recent Labs Lab 11/04/16 0138 11/05/16 0322  WBC 11.7* 8.9  RBC 4.47 4.32  HGB 12.5 12.0  HCT 38.1 37.0  MCV 85.2 85.6  MCH 28.0 27.8  MCHC 32.8 32.4  RDW 13.3 13.5  PLT 332 296    Cardiac Enzymes Recent Labs Lab 11/04/16 0825 11/04/16 1523 11/04/16 1956  TROPONINI <0.03 <0.03 <0.03    Recent Labs Lab 11/04/16 0142 11/04/16 0423  TROPIPOC 0.00 0.00     BNPNo results for input(s): BNP, PROBNP in the last 168 hours.   DDimer  Recent Labs Lab 11/04/16 0640  DDIMER <0.27     Radiology    Dg Chest Port 1 View  Result Date: 11/04/2016 CLINICAL DATA:  Chest pain and dizziness EXAM: PORTABLE CHEST 1 VIEW COMPARISON:  None.  FINDINGS: Borderline cardiomegaly. Low lung volume. No consolidation or effusion. No pneumothorax. IMPRESSION: Low lung volumes with borderline cardiomegaly. No edema or infiltrate. Electronically Signed   By: Jasmine PangKim  Fujinaga M.D.   On: 11/04/2016 02:23    Cardiac Studies   Echocardiogram: Pending  Patient Profile     51 y.o. female with no significant PMH who presented to [facility] 11/04/2016 for dyspnea and palpitations, found to be in new-onset atrial flutter.   Assessment & Plan    1. New Onset Atrial Flutter/Fibrillation - presented with new-onset dyspnea and palpitations, found to be in new-onset atrial fibrillation. TSH, Mg, K, H/H, renal function and d-dimer were normal. Cyclic troponin values have been negative.  - echocardiogram is pending to assess for structural abnormalities. She will need an outpatient sleep study.  - cardioversion with Flecainide 300mg  was attempted in the ED but she did not  convert. She refused DCCV while in the ED.  - This patients CHA2DS2-VASc Score and unadjusted Ischemic Stroke Rate (% per year) is equal to 0.6 % stroke rate/year from a score of 1 (Female). She has been started on Xarelto 20mg  daily for anticoagulation.  - I personally spent > 30 minutes discussing the pathology of atrial fibrillation and different options of an outpatient DCCV vs. TEE-guided DCCV this admission. She is still having HR elevation into the 140's with minimal activity with associated palpitations and dizziness. 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 After much discussion, she wishes to pursue TEE/DCCV tomorrow. Scheduled for 0800 with Dr. Jens Somrenshaw.     Leonides SchanzSigned, Ellsworth LennoxBrittany M Keenen Roessner , PA-C 9:38 AM 11/05/2016 Pager: [phone]

## 2016-11-05 NOTE — Progress Notes (Signed)
   Subjective: No acute events overnight, she reports resolution of chest pain/discomfort, continues to feel palpitations, especially when up and moving about the room. During assessment this morning, she was in both atrial fibrillation and atrial flutter at different points, tachycardic to 140s when walking across room and in 70s when at rest. Notes feeling dizziness when getting up from bed this morning.   Objective:  Vital signs in last 24 hours: Vitals:   11/04/16 2111 11/05/16 0038 11/05/16 0444 11/05/16 0747  BP:  105/69 121/82 102/77  Pulse:  69 74 75  Resp:  16 16 18   Temp: 98.1 F (36.7 C)  98.4 F (36.9 C) 99 F (37.2 C)  TempSrc: Oral  Oral Oral  SpO2:  99% 99% 97%  Weight:   225 lb 3.2 oz (102.2 kg)   Height:       Physical Exam  Constitutional: She is oriented to person, place, and time. She appears well-developed and well-nourished.  Pleasant female sitting in chair comfortably, no acute distress   Cardiovascular: Intact distal pulses.   Irregularly irregular rhythm, tachycardic   Pulmonary/Chest: Effort normal and breath sounds normal. No respiratory distress.  Neurological: She is alert and oriented to person, place, and time.  Skin: Skin is warm and dry. Capillary refill takes less than 2 seconds.     Assessment/Plan:  Atrial Flutter, Atrial Fibrillation, new onset Presented with new onset atrial flutter, her rhythm appears to alternate with periods of both flutter and fibrillation, negative troponins. Risk stratification labs unremarkable. She is rate controlled at rest but becomes tachycardic with light activity. Decision regarding cardioversion, timing, and anti-coagulation has been discussed and patient will elect for TEE cardioversion tomorrow per Cardiology --Cardiology consulted, appreciate their recommendations --Diltiazem 60 mg PO q6hrs --Xarelto 20 mg --TTE pending  --NPO @ [facility] for procedure   Dispo: Anticipated discharge in approximately 1-2 day(s).    Ginger CarneHarden, Atiba Kimberlin, MD 11/05/2016, 11:51 AM Pager: [phone]

## 2016-11-05 NOTE — Care Management Note (Addendum)
Case Management Note  Patient Details  Name: Tina Robinson MRN: [id number] Date of Birth: May 05, 1965  Subjective/Objective:  Pt presented for Atrial Flutter- Failed to convert on Flecainide. Plan for [facility] on Friday.  Plan for home on Xarelto. Benefits Check completed.                    Action/Plan: REESE  @ [employer] MANAGED # [phone] OPT- 4   1. ELIQUIS 2.5 MG BID  COVER- YES  CO-PAY- ZERO DOLLARS  PRIOR APPROVAL- NO   2. ELIQUIS 5 MG BIC  COVER- YES  CO-PAY- ZERO DOLLARS  PRIOR APPROVAL- NO   3.XARELTO 20 MG DAILY  COVER- YES  CO-PAY- ZERO DOLLARS  PRIOR APPROVAL-NO   PHARMACY : ANY RETAIL   Expected Discharge Date:                  Expected Discharge Plan:  Home/Self Care  In-House Referral:  NA  Discharge planning Services  CM Consult, Medication Assistance  Post Acute Care Choice:  NA Choice offered to:  NA  DME Arranged:  N/A DME Agency:  NA  HH Arranged:  NA HH Agency:  NA  Status of Service:  Completed, signed off  If discussed at Long Length of Stay Meetings, dates discussed:    Additional Comments: 1232 11-06-16 Tomi BambergerBrenda Graves-Bigelow, RN,BSN [phone] Pt can pick medications up at [facility] on [address]. Pt was provided 30 day free and co pay card. No further needs from CM at this time.  Gala LewandowskyGraves-Bigelow, Caden Fukushima Kaye, RN 11/05/2016, 2:38 PM

## 2016-11-05 NOTE — Progress Notes (Signed)
  Echocardiogram 2D Echocardiogram has been performed.  Aakash Hollomon L Androw 11/05/2016, 11:45 AM

## 2016-11-05 NOTE — Discharge Instructions (Signed)

## 2016-11-06 ENCOUNTER — Encounter (HOSPITAL_COMMUNITY)
Admission: EM | Disposition: A | Discharge status: Home / Self Care | Payer: Self-pay | Source: Home / Self Care | Attending: Emergency Medicine

## 2016-11-06 ENCOUNTER — Telehealth: Payer: Self-pay | Admitting: Physician Assistant

## 2016-11-06 ENCOUNTER — Encounter (HOSPITAL_COMMUNITY): Payer: Self-pay | Admitting: Certified Registered Nurse Anesthetist

## 2016-11-06 ENCOUNTER — Ambulatory Visit (HOSPITAL_COMMUNITY): Admit: 2016-11-06 | Payer: Managed Care, Other (non HMO) | Admitting: Cardiology

## 2016-11-06 ENCOUNTER — Other Ambulatory Visit (HOSPITAL_COMMUNITY): Payer: Self-pay

## 2016-11-06 DIAGNOSIS — I4892 Unspecified atrial flutter: Secondary | ICD-10-CM | POA: Diagnosis not present

## 2016-11-06 DIAGNOSIS — R079 Chest pain, unspecified: Secondary | ICD-10-CM | POA: Diagnosis not present

## 2016-11-06 LAB — CBC
HCT: 34.5 % — ABNORMAL LOW (ref 36.0–46.0)
Hemoglobin: 11.2 g/dL — ABNORMAL LOW (ref 12.0–15.0)
MCH: 28.2 pg (ref 26.0–34.0)
MCHC: 32.5 g/dL (ref 30.0–36.0)
MCV: 86.9 fL (ref 78.0–100.0)
Platelets: 311 10*3/uL (ref 150–400)
RBC: 3.97 MIL/uL (ref 3.87–5.11)
RDW: 13.8 % (ref 11.5–15.5)
WBC: 9.1 10*3/uL (ref 4.0–10.5)

## 2016-11-06 LAB — BASIC METABOLIC PANEL
Anion gap: 7 (ref 5–15)
BUN: 14 mg/dL (ref 6–20)
CO2: 27 mmol/L (ref 22–32)
Calcium: 8.6 mg/dL — ABNORMAL LOW (ref 8.9–10.3)
Chloride: 106 mmol/L (ref 101–111)
Creatinine, Ser: 0.88 mg/dL (ref 0.44–1.00)
GFR calc Af Amer: >60 mL/min (ref >60)
GFR calc non Af Amer: >60 mL/min (ref >60)
Glucose, Bld: 112 mg/dL — ABNORMAL HIGH (ref 65–99)
Potassium: 3.9 mmol/L (ref 3.5–5.1)
Sodium: 140 mmol/L (ref 135–145)

## 2016-11-06 SURGERY — CANCELLED PROCEDURE

## 2016-11-06 MED ORDER — RIVAROXABAN 20 MG PO TABS: 20.0000 mg | ORAL_TABLET | Freq: Every day | ORAL | 2 refills | Status: DC

## 2016-11-06 MED ORDER — DILTIAZEM HCL ER COATED BEADS 120 MG PO CP24: 120.0000 mg | ORAL_CAPSULE | Freq: Every day | ORAL | 2 refills | Status: DC

## 2016-11-06 MED ORDER — DILTIAZEM HCL ER COATED BEADS 120 MG PO CP24
120.0000 mg | ORAL_CAPSULE | Freq: Every day | ORAL | Status: DC
  Administered 2016-11-06: 120 mg via ORAL
  Filled 2016-11-06: qty 1

## 2016-11-06 NOTE — Telephone Encounter (Signed)
Received incoming records from [facility] for upcoming appointment on 11/24/16 @ 8:00am with Theodore Demark. Records given to [facility] in Medical Records. 11/06/16/ab

## 2016-11-06 NOTE — Anesthesia Preprocedure Evaluation (Deleted)
Anesthesia Evaluation  Patient identified by MRN, date of birth, ID band Patient awake    Reviewed: Allergy & Precautions, NPO status , Patient's Chart, lab work & pertinent test results  Airway Mallampati: II  TM Distance: >3 FB Neck ROM: Full    Dental  (+) Teeth Intact, Dental Advisory Given   Pulmonary neg pulmonary ROS,    Pulmonary exam normal breath sounds clear to auscultation       Cardiovascular Exercise Tolerance: Good negative cardio ROS Normal cardiovascular exam+ dysrhythmias Atrial Fibrillation  Rhythm:Regular Rate:Normal     Neuro/Psych negative neurological ROS     GI/Hepatic negative GI ROS, Neg liver ROS,   Endo/Other  Obesity   Renal/GU negative Renal ROS     Musculoskeletal negative musculoskeletal ROS (+)   Abdominal   Peds  Hematology  (+) Blood dyscrasia (rivaroxaban), anemia ,   Anesthesia Other Findings Day of surgery medications reviewed with the patient.  Reproductive/Obstetrics                             Anesthesia Physical Anesthesia Plan  ASA: III  Anesthesia Plan: MAC   Post-op Pain Management:    Induction: Intravenous  PONV Risk Score and Plan: 2 and Treatment may vary due to age or medical condition  Airway Management Planned: Nasal Cannula  Additional Equipment:   Intra-op Plan:   Post-operative Plan:   Informed Consent: I have reviewed the patients History and Physical, chart, labs and discussed the procedure including the risks, benefits and alternatives for the proposed anesthesia with the patient or authorized representative who has indicated his/her understanding and acceptance.   Dental advisory given  Plan Discussed with: CRNA and Anesthesiologist  Anesthesia Plan Comments: (Discussed risks/benefits/alternatives to MAC sedation including need for ventilatory support, hypotension, need for conversion to general anesthesia.  All  patient questions answered.  Patient/guardian wishes to proceed.)        Anesthesia Quick Evaluation

## 2016-11-06 NOTE — Discharge Summary (Signed)
Name: Tina Robinson MRN: [id number] DOB: [birth year] 51 y.o. PCP: Patient, No Pcp Per  Date of Admission: 11/04/2016  1:39 AM Date of Discharge: 11/06/2016 Attending Physician: Tyson Alias, *  Discharge Diagnosis: Principal Problem:   Atrial flutter with rapid ventricular response [facility]) Active Problems:   Chest pain, rule out acute myocardial infarction   Discharge Medications: Allergies as of 11/06/2016      Reactions   Mushroom Extract Complex Anaphylaxis      Medication List    TAKE these medications   diltiazem 120 MG 24 hr capsule Commonly known as:  CARDIZEM CD Take 1 capsule (120 mg total) by mouth daily.   multivitamin with minerals Tabs tablet Take 1 tablet by mouth daily.   rivaroxaban 20 MG Tabs tablet Commonly known as:  XARELTO Take 1 tablet (20 mg total) by mouth daily with supper.   VITAMIN D PO Take 1 tablet by mouth daily.       Disposition and follow-up:   Tina Robinson was discharged from [facility] in Good condition.  At the hospital follow up visit please address:  1.  --Assess for recurrent palpitations/sx and current rhythm --Discuss need for anti-coagulation  --Consider sleep study   2.  Labs / imaging needed at time of follow-up: EKG  3.  Pending labs/ test needing follow-up: None  Follow-up Appointments: Follow-up Information    Barrett, Joline Salt, PA-C Follow up on 11/24/2016.   Specialties:  Cardiology, Radiology Why:  Your appointment is scheduledf or 8 am, please arrive 10-15 minutes early. Contact information: [address] [place] [address] [phone]           Hospital Course by problem list:   Atrial Flutter/Fibrillation with Rapid Ventricular Response Pt presented with chest discomfort and palpitations, found to be in atrial flutter on initial EKGs. She had no ischemic changes and troponins were negative. Cardiology was consulted and assisted with management  throughout hospitalization. A dose of Flecainide was unsuccessful in restoring sinus rhythm. She was then started on PO Diltiazem 60 mg q6 hrs for rate control. Multiple discussions were held regarding options for immediate vs delayed cardioversion and anti-coagulation management. She spontaneously converted to normal sinus rhythm with improvement in her symptoms and avoided the need for the planned TEE cardioversion. A TTE was performed that showed no structural abnormalities, normal EF. She was discharged home with Diltiazem XR and Xarelto for anti-coagulation. She is relatively low risk for embolic events with CHADVASc of 1 for gender, however, she wished to continue anti-coagulation in the short term and will discuss on follow up.    Discharge Vitals:   BP 112/69   Pulse 72   Temp 98.2 F (36.8 C) (Oral)   Resp 17   Ht 5' 7.5" (1.715 m)   Wt 226 lb 4.8 oz (102.6 kg)   LMP 01/12/2012   SpO2 99%   BMI 34.92 kg/m   Pertinent Labs, Studies, and Procedures:   TTE: - Left ventricle: The cavity size was normal. Wall thickness was   normal. Systolic function was at the lower limits of normal. The   estimated ejection fraction was in the range of 50% to 55%. Wall   motion was normal; there were no regional wall motion   abnormalities. The study is not technically sufficient to allow   evaluation of LV diastolic function. - Right ventricle: Systolic function was mildly reduced.   Discharge Instructions: Discharge Instructions    Discharge instructions  Complete by:  As directed    --You'll be on two new medicines: Cardizem for your heart rate and Xarelto, a blood thinner. We sent these prescriptions to your pharmacy --You have a cardiology appointment arranged for Tuesday Sept. 4th at 8 am and they will likely confirm this with you as well.      Signed: Ginger Carne, MD 11/06/2016, 12:44 PM   Pager: [phone]

## 2016-11-06 NOTE — Progress Notes (Signed)
Progress Note  Patient Name: Tina Robinson Date of Encounter: 11/06/2016  Primary Cardiologist: New (Dr. Royann Shivers)  Subjective   Back into normal rhythm,  Overall feeling well. She did have an episode of right arm and chest discomfort yesterday that resolved on its own. She has been ambulatory in the room and hallways without any CP, SOB or palpitations this AM.   Inpatient Medications    Scheduled Meds: . diltiazem  60 mg Oral Q6H  . rivaroxaban  20 mg Oral Q supper  . sodium chloride flush  3 mL Intravenous Q12H  . sodium chloride flush  3 mL Intravenous Q12H   Continuous Infusions: . sodium chloride     PRN Meds: acetaminophen **OR** acetaminophen, sodium chloride flush   Vital Signs    Vitals:   11/05/16 1151 11/05/16 1638 11/05/16 2004 11/06/16 0548  BP: 122/82 130/81 114/74 (!) 92/48  Pulse: 79 76 75 72  Resp: 16 20 16 17   Temp: 98 F (36.7 C) 98.6 F (37 C) 98.2 F (36.8 C) 98.2 F (36.8 C)  TempSrc: Oral Oral Oral Oral  SpO2: 100% 100% 100% 99%  Weight:    226 lb 4.8 oz (102.6 kg)  Height:        Intake/Output Summary (Last 24 hours) at 11/06/16 0904 Last data filed at 11/06/16 0600  Gross per 24 hour  Intake             1080 ml  Output                0 ml  Net             1080 ml   Filed Weights   11/04/16 1734 11/05/16 0444 11/06/16 0548  Weight: 230 lb (104.3 kg) 225 lb 3.2 oz (102.2 kg) 226 lb 4.8 oz (102.6 kg)    Telemetry    Sinus rhythm, rate controlled. - Personally Reviewed   Physical Exam   GEN: Well nourished, well developed, obese, AA, female HEENT: normal  Neck: no JVD, carotid bruits, or masses Cardiac: RRR. no murmurs, rubs, or gallops,no edema. Intact distal pulses bilaterally.  Respiratory: clear to auscultation bilaterally, normal work of breathing GI: soft, nontender, nondistended, + BS MS: no deformity or atrophy  Skin: warm and dry, no rash Neuro: Alert and Oriented x 3, Strength and sensation are intact Psych:    Full affect  Labs    Chemistry Recent Labs Lab 11/04/16 0825 11/05/16 0322 11/06/16 0406  NA 141 140 140  K 3.9 3.7 3.9  CL 104 107 106  CO2 26 24 27   GLUCOSE 106* 116* 112*  BUN 12 14 14   CREATININE 0.87 0.95 0.88  CALCIUM 8.9 8.6* 8.6*  PROT 7.4  --   --   ALBUMIN 3.7  --   --   AST 25  --   --   ALT 19  --   --   ALKPHOS 81  --   --   BILITOT 0.5  --   --   GFRNONAA >60 >60 >60  GFRAA >60 >60 >60  ANIONGAP 11 9 7      Hematology Recent Labs Lab 11/04/16 0138 11/05/16 0322 11/06/16 0406  WBC 11.7* 8.9 9.1  RBC 4.47 4.32 3.97  HGB 12.5 12.0 11.2*  HCT 38.1 37.0 34.5*  MCV 85.2 85.6 86.9  MCH 28.0 27.8 28.2  MCHC 32.8 32.4 32.5  RDW 13.3 13.5 13.8  PLT 332 296 311    Cardiac Enzymes Recent Labs  Lab 11/04/16 0825 11/04/16 1523 11/04/16 1956  TROPONINI <0.03 <0.03 <0.03    Recent Labs Lab 11/04/16 0142 11/04/16 0423  TROPIPOC 0.00 0.00     BNPNo results for input(s): BNP, PROBNP in the last 168 hours.   DDimer  Recent Labs Lab 11/04/16 0640  DDIMER <0.27     Radiology    No results found.  Cardiac Studies   Transthoracic Echocardiography (11/06/2016)  Study Conclusions  - Left ventricle: The cavity size was normal. Wall thickness was   normal. Systolic function was at the lower limits of normal. The   estimated ejection fraction was in the range of 50% to 55%. Wall   motion was normal; there were no regional wall motion   abnormalities. The study is not technically sufficient to allow   evaluation of LV diastolic function. - Right ventricle: Systolic function was mildly reduced.  Patient Profile     Tina Robinson is a 51 y.o. female no significant PMH, who is being seen for the evaluation of new onset atrial flutter, at the request of Dr. Oswaldo Done, Internal Medicine. As outlined above, she has no significant PMH. No prior h/o cardiac disease. No pertinent family history. No risk factors. She denies h/o HTN, HLD, DM or tobacco  use. No prior h/o stroke or TIA. She works as an Database administrator.  Assessment & Plan    1. New onset atrial flutter/fibrillation: She is on Cardizem 60 mg PO for rate control. She has been up on the schedule today for TEE-DCCV but after discussion with Dr. Royann Shivers yesterday she preferred conservative measures of 3-4 weeks of anticoagulation then to consider cardioversion as an outpatient.  She has spontaneously converted to sinus rhythm this morning. She has been started on Xarelto, CHA2DS2 VASc score is only 1 for female.  She has poss pmh of hypertension so she may have a score of 2. She needs to continue NOAC for one month. Fortunately she did not have any significant structural abnormalities found on ECHO therefore discussion of short term vs long term anticoagulation therapy can probably be discussed at follow-up appointment. -- She may benefit from a HR monitor to understand if she has been having afib without knowing.  2. Chest pain and right arm pain: She had an episode of right arm pain and chest discomfort yesterday. She had more of this pain last night which made it difficult for her to sleep. Trop neg  x3. We may need to consider ischemic work-up inpatient vs outpatient to further evaluation this. Currently pain free.  She needs an outpatient sleep study.   Suan Halter, PA-C  11/06/2016, 9:04 AM

## 2016-11-06 NOTE — Progress Notes (Signed)
   Subjective: Overnight she spontaneously returned to normal sinus rhythm and remains in NSR this morning. As a result, the planned TEE cardioversion was cancelled. She reports better activity tolerance this morning, no longer feels palpitation sensations. At this point, she would like to continue anti-coagulation and discuss further on follow up.   Objective:  Vital signs in last 24 hours: Vitals:   11/05/16 1638 11/05/16 2004 11/06/16 0548 11/06/16 1130  BP: 130/81 114/74 (!) 92/48 112/69  Pulse: 76 75 72   Resp: 20 16 17    Temp: 98.6 F (37 C) 98.2 F (36.8 C) 98.2 F (36.8 C)   TempSrc: Oral Oral Oral   SpO2: 100% 100% 99%   Weight:   226 lb 4.8 oz (102.6 kg)   Height:       Physical Exam  Constitutional: She is oriented to person, place, and time. She appears well-developed and well-nourished.  Pleasant female sitting in chair comfortably, no acute distress   Cardiovascular: Normal rate, regular rhythm and intact distal pulses.   Pulmonary/Chest: Effort normal and breath sounds normal. No respiratory distress.  Neurological: She is alert and oriented to person, place, and time.  Skin: Skin is warm and dry. Capillary refill takes less than 2 seconds.    Assessment/Plan:  Atrial Flutter, Atrial Fibrillation, new onset Presented with new onset atrial flutter, her rhythm appears to alternate with periods of both flutter and fibrillation, negative troponins. Risk stratification labs unremarkable. She spontaneously returned to NSR. TEE cardioversion aborted due to conversion to NSR, TTE with normal EF, normal atria size.  --Cardiology consulted, appreciate their recommendations --Switch to Diltiazem XR  --Xarelto 20 mg --Est f/u with cardiology as outpatient   Dispo: Anticipated discharge today.    Ginger Carne, MD 11/06/2016, 12:39 PM Pager: [phone]

## 2016-11-09 ENCOUNTER — Ambulatory Visit (INDEPENDENT_AMBULATORY_CARE_PROVIDER_SITE_OTHER): Payer: Managed Care, Other (non HMO) | Admitting: *Deleted

## 2016-11-09 VITALS — BP 140/90 | HR 78

## 2016-11-09 DIAGNOSIS — R079 Chest pain, unspecified: Secondary | ICD-10-CM | POA: Diagnosis not present

## 2016-11-09 DIAGNOSIS — Z79899 Other long term (current) drug therapy: Secondary | ICD-10-CM

## 2016-11-09 DIAGNOSIS — R0602 Shortness of breath: Secondary | ICD-10-CM | POA: Diagnosis not present

## 2016-11-09 DIAGNOSIS — I4892 Unspecified atrial flutter: Secondary | ICD-10-CM

## 2016-11-09 NOTE — Progress Notes (Signed)
1.) Reason for visit --PATIENT WALK-IN TO OFFICE with complaints of chest pain, shortness of breathe, right side chest pain. Patient states she had symptoms like this prior to hospitalization last week. Patient also request anote to return to work  2.) Name of MD requesting visit n/a  3.) H&P- n/a  4.) ROS related to problem RN checked blood pressure and ekg obtained , medication reviewed.  5.) Assessment and plan per MD - reviewed with  Dr Rennis Golden AND B. STRADER PA.  RN REASSURED PATIENT AND INSTRUCTION GIVEN. NOTE GIVEN AND PER DR HILTY A EXERCISE STRESS MYOVIEW ORDERED AND PATIENTESCORTED TO SCHEDULER.

## 2016-11-09 NOTE — Patient Instructions (Signed)
Continue with medications  May return back to work on 11/10/16   Your physician has requested that you have en exercise stress myoview. For further information please visit [email or web address] Please follow instruction sheet, as given.   Keep appointment 11/24/16 with R BARRETT.

## 2016-11-11 ENCOUNTER — Telehealth (HOSPITAL_COMMUNITY): Payer: Self-pay

## 2016-11-11 NOTE — Telephone Encounter (Signed)
Encounter complete. 

## 2016-11-12 ENCOUNTER — Telehealth (HOSPITAL_COMMUNITY): Payer: Self-pay | Admitting: *Deleted

## 2016-11-12 ENCOUNTER — Telehealth: Payer: Self-pay | Admitting: *Deleted

## 2016-11-12 DIAGNOSIS — R0602 Shortness of breath: Secondary | ICD-10-CM

## 2016-11-12 DIAGNOSIS — R079 Chest pain, unspecified: Secondary | ICD-10-CM | POA: Insufficient documentation

## 2016-11-12 NOTE — Telephone Encounter (Signed)
-----   Message from [facility] sent at 11/11/2016  4:55 PM EDT ----- Regarding: FW: Denied Nuc Test Jasmine December  Can you please put in order for GXT?  Thanks  Charmaine ----- Message ----- From: Chrystie Nose, MD Sent: 11/11/2016   3:41 PM To: Britt Bolognese Subject: RE: Denied Nuc Test                            Ok .., then just do a plan graded exercise stress test without imaging.  Thanks  ----- Message ----- From: Britt Bolognese Sent: 11/11/2016   1:17 PM To: Chrystie Nose, MD, Tobin Chad, RN Subject: Denied Nuc Test                                Dr. Hervey Ard denied pt's nuc test for 11-13-16.  I uploaded Zachari Alberta's phone note and Dr. Erin Hearing hosp consult note w/ECG results.  Their rationale:  Pt is able to exercise on a treadmill.    Please advise  Charmaine

## 2016-11-12 NOTE — Telephone Encounter (Signed)
SPOKE TO PATIENT.AWARE OF THE CHANGE IN TESTING. AWARE SCHEDULER WILL BE CALLING

## 2016-11-12 NOTE — Telephone Encounter (Signed)
ORDERED GXT,  PER DR HILTY.  LEFT MESSAGE FOR PATIENT TO CALL BACK --TO NOTIFY

## 2016-11-12 NOTE — Telephone Encounter (Signed)
Close encounter 

## 2016-11-13 ENCOUNTER — Encounter (HOSPITAL_COMMUNITY): Payer: Self-pay

## 2016-11-13 ENCOUNTER — Ambulatory Visit (HOSPITAL_COMMUNITY)
Admission: RE | Admit: 2016-11-13 | Discharge: 2016-11-13 | Disposition: A | Discharge status: Home / Self Care | Payer: Managed Care, Other (non HMO) | Source: Ambulatory Visit | Attending: Internal Medicine | Admitting: Internal Medicine

## 2016-11-13 DIAGNOSIS — R0602 Shortness of breath: Secondary | ICD-10-CM | POA: Diagnosis not present

## 2016-11-13 DIAGNOSIS — R079 Chest pain, unspecified: Secondary | ICD-10-CM | POA: Diagnosis present

## 2016-11-13 LAB — EXERCISE TOLERANCE TEST
Estimated workload: 7 METS
Exercise duration (min): 6 min
Exercise duration (sec): 0 s
MPHR: 167 {beats}/min
Peak HR: 176 {beats}/min
Percent HR: 105 %
RPE: 17
Rest HR: 81 {beats}/min

## 2016-11-24 ENCOUNTER — Encounter: Payer: Self-pay | Admitting: Physician Assistant

## 2016-11-24 ENCOUNTER — Ambulatory Visit (INDEPENDENT_AMBULATORY_CARE_PROVIDER_SITE_OTHER): Payer: Managed Care, Other (non HMO) | Admitting: Physician Assistant

## 2016-11-24 VITALS — BP 142/96 | HR 70 | Ht 67.5 in | Wt 228.2 lb

## 2016-11-24 DIAGNOSIS — Z7901 Long term (current) use of anticoagulants: Secondary | ICD-10-CM | POA: Diagnosis not present

## 2016-11-24 DIAGNOSIS — I10 Essential (primary) hypertension: Secondary | ICD-10-CM | POA: Diagnosis not present

## 2016-11-24 DIAGNOSIS — I4892 Unspecified atrial flutter: Secondary | ICD-10-CM | POA: Diagnosis not present

## 2016-11-24 MED ORDER — DILTIAZEM HCL ER COATED BEADS 180 MG PO CP24: 180.0000 mg | ORAL_CAPSULE | Freq: Every day | ORAL | 2 refills | Status: DC

## 2016-11-24 NOTE — Progress Notes (Signed)
  Cardiology Office Note   Date:  11/24/2016   ID:  Tina Robinson, DOB 04/12/1965, MRN [id number]  PCP:  Patient, No Pcp Per  Cardiologist:  Dr. Croitoru, in-hospital 11/04/2016  Robinson, Rhonda, PA-C    History of Present Illness: Tina Robinson is a 51 y.o. female with a history of atrial flutter diagnosed 11/04/2016, heart rate initially in the 160s, TSH normal. Rate control with Cardizem, anticoagulation with Xarelto and possible cardioversion in 3 weeks  Tina Robinson presents for cardiology follow up.  She has not felt her heart racing for prolonged periods of time since d/c. She has had some brief palpitations, she was having these before she went to the hospital and has had some sense as well. She had some SOB with the palpitations.   She is now in SR. The palpitations have happened generally while at work. She has not been walking as she had been. She feels she can start back, is encouraged to do so.   No side effects from the Cardizem. Had gums bleeding x 1 while on Xarelto, hasn't happened since.   She admits that if her heart rate were controlled, she would not be aware that she was in atrial flutter.   Past Medical History:  Diagnosis Date  . Atrial flutter, paroxysmal (HCC) 11/04/2016    Past Surgical History:  Procedure Laterality Date  . CESAREAN SECTION      Current Outpatient Prescriptions  Medication Sig Dispense Refill  . Cholecalciferol (VITAMIN D PO) Take 1 tablet by mouth daily.    . diltiazem (CARDIZEM CD) 120 MG 24 hr capsule Take 1 capsule (120 mg total) by mouth daily. 30 capsule 2  . Multiple Vitamin (MULTIVITAMIN WITH MINERALS) TABS tablet Take 1 tablet by mouth daily.    . rivaroxaban (XARELTO) 20 MG TABS tablet Take 1 tablet (20 mg total) by mouth daily with supper. 30 tablet 2   No current facility-administered medications for this visit.     Allergies:   Mushroom extract complex    Social History:  The patient  reports that she has  never smoked. She has never used smokeless tobacco. She reports that she does not drink alcohol or use drugs.   Family History:  The patient's family history includes Hypertension in her mother; Peripheral Artery Disease in her mother.    ROS:  Please see the history of present illness. All other systems are reviewed and negative.    PHYSICAL EXAM: VS:  BP (!) 142/96   Pulse 70   Ht 5' 7.5" (1.715 m)   Wt 228 lb 3.2 oz (103.5 kg)   LMP 01/12/2012   BMI 35.21 kg/m  , BMI Body mass index is 35.21 kg/m. GEN: Well nourished, well developed, female in no acute distress  HEENT: normal for age  Neck: no JVD, no carotid bruit, no masses Cardiac: RRR; no murmur, no rubs, or gallops Respiratory:  clear to auscultation bilaterally, normal work of breathing GI: soft, nontender, nondistended, + BS MS: no deformity or atrophy; no edema; distal pulses are 2+ in all 4 extremities   Skin: warm and dry, no rash Neuro:  Strength and sensation are intact Psych: euthymic mood, full affect   EKG:  EKG is ordered today. The ekg ordered today demonstrates sinus rhythm, heart rate 70, no acute ischemic changes, normal intervals  ETT: 11/13/2016  Blood pressure demonstrated a hypertensive response to exercise.  There was no ST segment deviation noted during stress.  Normal exercise stress test.   No ischemia. Mildly decreased exercise capacity. Hypertensive response to exercise (211/85 mmHg).  ECHO: 11/05/2016 - Left ventricle: The cavity size was normal. Wall thickness was   normal. Systolic function was at the lower limits of normal. The   estimated ejection fraction was in the range of 50% to 55%. Wall   motion was normal; there were no regional wall motion   abnormalities. The study is not technically sufficient to allow   evaluation of LV diastolic function. - Right ventricle: Systolic function was mildly reduced. - Left atrium: Normal in size - Right atrium: Normal in size   Recent  Labs: 11/04/2016: ALT 19; Magnesium 2.2; TSH 2.152 11/06/2016: BUN 14; Creatinine, Ser 0.88; Hemoglobin 11.2; Platelets 311; Potassium 3.9; Sodium 140    Lipid Panel    Component Value Date/Time   CHOL 159 11/05/2016 0322   TRIG 65 11/05/2016 0322   HDL 56 11/05/2016 0322   CHOLHDL 2.8 11/05/2016 0322   VLDL 13 11/05/2016 0322   LDLCALC 90 11/05/2016 0322     Wt Readings from Last 3 Encounters:  11/24/16 228 lb 3.2 oz (103.5 kg)  11/06/16 226 lb 4.8 oz (102.6 kg)     Other studies Reviewed: Additional studies/ records that were reviewed today include: Hospital records and testing.  ASSESSMENT AND PLAN:  1.  Paroxysmal atrial flutter: She is back in sinus rhythm, but cannot say when she went back in or if she has been in and out of atrial flutter since discharge. She had a hypertensive response to exercise. I will increase her Cardizem up to 180 mg daily.  I do not think we would get an accurate representation of help much atrial flutter she is having with a 30 day event monitor. Therefore, I feel a loop recorder is indicated. This was reviewed with Dr. Royann Shiversroitoru, who agrees. If she has multiple episodes of atrial flutter, especially with a high heart rate, consider ablation. If she has a prolonged period of time without any atrial flutter, consider stopping Xarelto.  The risks and benefits of a Loop recorder were discussed with the patient who indicates understanding and agrees to proceed.   2. Anticoagulation: She is tolerating Xarelto generally well. If she has additional episodes of bleeding gums, she is to report this. This patients CHA2DS2-VASc Score and unadjusted Ischemic Stroke Rate (% per year) is equal to 2.2 % stroke rate/year from a score of 2 Above score calculated as 1 point each if present [CHF, HTN, DM, Vascular=MI/PAD/Aortic Plaque, Age if 65-74, or Female], 2 points each if present [Age > 75, or Stroke/TIA/TE]  3. Hypertension: She did not have a previous diagnosis of  this but all of her blood pressures have been elevated. She likely had underlying hypertension not previously diagnosed. Continue Cardizem.   Current medicines are reviewed at length with the patient today.  The patient does not have concerns regarding medicines.  The following changes have been made:  Increase Cardizem  Labs/ tests ordered today include:   Orders Placed This Encounter  Procedures  . EKG 12-Lead     Disposition:   FU with Dr. Royann Shiversroitoru  Signed, Theodore DemarkBarrett, Rhonda, PA-C  11/24/2016 11:06 AM    [facility] Phone: [phone]; Fax: [phone]  This note was written with the assistance of speech recognition software. Please excuse any transcriptional errors.

## 2016-11-24 NOTE — Patient Instructions (Addendum)
Medication Instructions:  INCREASE CARDIZEM 180MG  DAILY If you need a refill on your cardiac medications before your next appointment, please call your pharmacy.  Follow-Up: Your physician wants you to follow-up in: WITH DR CROITORU AT 1ST AVILABLE   Procedures: LOOP RECORDER IMPLANT  Special Instructions: OK TO RE-START EXERCISING    Thank you for choosing [facility] [place]!!

## 2016-11-24 NOTE — Progress Notes (Signed)
Discussed during office visit. Agree with plan. MCr

## 2016-11-26 ENCOUNTER — Ambulatory Visit (HOSPITAL_COMMUNITY)
Admission: RE | Admit: 2016-11-26 | Discharge: 2016-11-26 | Disposition: A | Discharge status: Home / Self Care | Payer: Managed Care, Other (non HMO) | Source: Ambulatory Visit | Attending: Cardiovascular Disease | Admitting: Cardiovascular Disease

## 2016-11-26 ENCOUNTER — Encounter (HOSPITAL_COMMUNITY)
Admission: RE | Disposition: A | Discharge status: Home / Self Care | Payer: Self-pay | Source: Ambulatory Visit | Attending: Cardiovascular Disease

## 2016-11-26 DIAGNOSIS — R030 Elevated blood-pressure reading, without diagnosis of hypertension: Secondary | ICD-10-CM | POA: Diagnosis not present

## 2016-11-26 DIAGNOSIS — I4892 Unspecified atrial flutter: Secondary | ICD-10-CM | POA: Insufficient documentation

## 2016-11-26 DIAGNOSIS — I483 Typical atrial flutter: Secondary | ICD-10-CM | POA: Diagnosis not present

## 2016-11-26 DIAGNOSIS — Z7901 Long term (current) use of anticoagulants: Secondary | ICD-10-CM | POA: Insufficient documentation

## 2016-11-26 DIAGNOSIS — I480 Paroxysmal atrial fibrillation: Secondary | ICD-10-CM | POA: Diagnosis present

## 2016-11-26 HISTORY — PX: LOOP RECORDER INSERTION: EP1214

## 2016-11-26 SURGERY — LOOP RECORDER INSERTION

## 2016-11-26 MED ORDER — LIDOCAINE HCL (PF) 1 % IJ SOLN
INTRAMUSCULAR | Status: DC | PRN
  Administered 2016-11-26: 30 mL

## 2016-11-26 MED ORDER — LIDOCAINE HCL (PF) 1 % IJ SOLN
INTRAMUSCULAR | Status: AC
  Filled 2016-11-26: qty 30

## 2016-11-26 SURGICAL SUPPLY — 2 items
LOOP REVEAL LINQSYS (Prosthesis & Implant Heart) ×3 IMPLANT
PACK LOOP INSERTION (CUSTOM PROCEDURE TRAY) ×3 IMPLANT

## 2016-11-26 NOTE — H&P (View-Only) (Signed)
Cardiology Office Note   Date:  11/24/2016   ID:  Tina Robinson, DOB 1966-01-03, MRN [id number]  PCP:  Patient, No Pcp Per  Cardiologist:  Dr. Royann Shivers, in-hospital 11/04/2016  Kanya Potteiger, Bjorn Loser, PA-C    History of Present Illness: Tina Robinson is a 51 y.o. female with a history of atrial flutter diagnosed 11/04/2016, heart rate initially in the 160s, TSH normal. Rate control with Cardizem, anticoagulation with Xarelto and possible cardioversion in 3 weeks  Tina Robinson presents for cardiology follow up.  She has not felt her heart racing for prolonged periods of time since d/c. She has had some brief palpitations, she was having these before she went to the hospital and has had some sense as well. She had some SOB with the palpitations.   She is now in SR. The palpitations have happened generally while at work. She has not been walking as she had been. She feels she can start back, is encouraged to do so.   No side effects from the Cardizem. Had gums bleeding x 1 while on Xarelto, hasn't happened since.   She admits that if her heart rate were controlled, she would not be aware that she was in atrial flutter.   Past Medical History:  Diagnosis Date  . Atrial flutter, paroxysmal (HCC) 11/04/2016    Past Surgical History:  Procedure Laterality Date  . CESAREAN SECTION      Current Outpatient Prescriptions  Medication Sig Dispense Refill  . Cholecalciferol (VITAMIN D PO) Take 1 tablet by mouth daily.    Marland Kitchen diltiazem (CARDIZEM CD) 120 MG 24 hr capsule Take 1 capsule (120 mg total) by mouth daily. 30 capsule 2  . Multiple Vitamin (MULTIVITAMIN WITH MINERALS) TABS tablet Take 1 tablet by mouth daily.    . rivaroxaban (XARELTO) 20 MG TABS tablet Take 1 tablet (20 mg total) by mouth daily with supper. 30 tablet 2   No current facility-administered medications for this visit.     Allergies:   Mushroom extract complex    Social History:  The patient  reports that she has  never smoked. She has never used smokeless tobacco. She reports that she does not drink alcohol or use drugs.   Family History:  The patient's family history includes Hypertension in her mother; Peripheral Artery Disease in her mother.    ROS:  Please see the history of present illness. All other systems are reviewed and negative.    PHYSICAL EXAM: VS:  BP (!) 142/96   Pulse 70   Ht 5' 7.5" (1.715 m)   Wt 228 lb 3.2 oz (103.5 kg)   LMP 01/12/2012   BMI 35.21 kg/m  , BMI Body mass index is 35.21 kg/m. GEN: Well nourished, well developed, female in no acute distress  HEENT: normal for age  Neck: no JVD, no carotid bruit, no masses Cardiac: RRR; no murmur, no rubs, or gallops Respiratory:  clear to auscultation bilaterally, normal work of breathing GI: soft, nontender, nondistended, + BS MS: no deformity or atrophy; no edema; distal pulses are 2+ in all 4 extremities   Skin: warm and dry, no rash Neuro:  Strength and sensation are intact Psych: euthymic mood, full affect   EKG:  EKG is ordered today. The ekg ordered today demonstrates sinus rhythm, heart rate 70, no acute ischemic changes, normal intervals  ETT: 11/13/2016  Blood pressure demonstrated a hypertensive response to exercise.  There was no ST segment deviation noted during stress.  Normal exercise stress test.  No ischemia. Mildly decreased exercise capacity. Hypertensive response to exercise (211/85 mmHg).  ECHO: 11/05/2016 - Left ventricle: The cavity size was normal. Wall thickness was   normal. Systolic function was at the lower limits of normal. The   estimated ejection fraction was in the range of 50% to 55%. Wall   motion was normal; there were no regional wall motion   abnormalities. The study is not technically sufficient to allow   evaluation of LV diastolic function. - Right ventricle: Systolic function was mildly reduced. - Left atrium: Normal in size - Right atrium: Normal in size   Recent  Labs: 11/04/2016: ALT 19; Magnesium 2.2; TSH 2.152 11/06/2016: BUN 14; Creatinine, Ser 0.88; Hemoglobin 11.2; Platelets 311; Potassium 3.9; Sodium 140    Lipid Panel    Component Value Date/Time   CHOL 159 11/05/2016 0322   TRIG 65 11/05/2016 0322   HDL 56 11/05/2016 0322   CHOLHDL 2.8 11/05/2016 0322   VLDL 13 11/05/2016 0322   LDLCALC 90 11/05/2016 0322     Wt Readings from Last 3 Encounters:  11/24/16 228 lb 3.2 oz (103.5 kg)  11/06/16 226 lb 4.8 oz (102.6 kg)     Other studies Reviewed: Additional studies/ records that were reviewed today include: Hospital records and testing.  ASSESSMENT AND PLAN:  1.  Paroxysmal atrial flutter: She is back in sinus rhythm, but cannot say when she went back in or if she has been in and out of atrial flutter since discharge. She had a hypertensive response to exercise. I will increase her Cardizem up to 180 mg daily.  I do not think we would get an accurate representation of help much atrial flutter she is having with a 30 day event monitor. Therefore, I feel a loop recorder is indicated. This was reviewed with Dr. Royann Shiversroitoru, who agrees. If she has multiple episodes of atrial flutter, especially with a high heart rate, consider ablation. If she has a prolonged period of time without any atrial flutter, consider stopping Xarelto.  The risks and benefits of a Loop recorder were discussed with the patient who indicates understanding and agrees to proceed.   2. Anticoagulation: She is tolerating Xarelto generally well. If she has additional episodes of bleeding gums, she is to report this. This patients CHA2DS2-VASc Score and unadjusted Ischemic Stroke Rate (% per year) is equal to 2.2 % stroke rate/year from a score of 2 Above score calculated as 1 point each if present [CHF, HTN, DM, Vascular=MI/PAD/Aortic Plaque, Age if 65-74, or Female], 2 points each if present [Age > 75, or Stroke/TIA/TE]  3. Hypertension: She did not have a previous diagnosis of  this but all of her blood pressures have been elevated. She likely had underlying hypertension not previously diagnosed. Continue Cardizem.   Current medicines are reviewed at length with the patient today.  The patient does not have concerns regarding medicines.  The following changes have been made:  Increase Cardizem  Labs/ tests ordered today include:   Orders Placed This Encounter  Procedures  . EKG 12-Lead     Disposition:   FU with Dr. Royann Shiversroitoru  Signed, Theodore DemarkBarrett, Katalia Choma, PA-C  11/24/2016 11:06 AM    [facility] Phone: [phone]; Fax: [phone]  This note was written with the assistance of speech recognition software. Please excuse any transcriptional errors.

## 2016-11-26 NOTE — Interval H&P Note (Signed)
History and Physical Interval Note:  11/26/2016 2:18 PM  Tina Robinson  has presented today for surgery, with the diagnosis of aflutter  The various methods of treatment have been discussed with the patient and family. After consideration of risks, benefits and other options for treatment, the patient has consented to  Procedure(s): LOOP RECORDER INSERTION (N/A) as a surgical intervention .  The patient's history has been reviewed, patient examined, no change in status, stable for surgery.  I have reviewed the patient's chart and labs.  Questions were answered to the patient's satisfaction.     Hydia Copelin

## 2016-11-26 NOTE — Op Note (Signed)
LOOP RECORDER IMPLANT   Procedure report  Procedure performed:  Loop recorder implantation   Reason for procedure:  Atrial flutter management Procedure performed by:  Thurmon FairMihai Danil Wedge, MD  Complications:  None  Estimated blood loss:  <5 mL  Medications administered during procedure:  Lidocaine 1% 10 mL locally Device details:  Medtronic Reveal Linq model number [id number], serial number [id number] S Procedure details:  After the risks and benefits of the procedure were discussed the patient provided informed consent. The patient was prepped and draped in usual sterile fashion. Local anesthesia was administered to an area 2 cm to the left of the sternum in the 4th intercostal space. A cutaneous incision was made using the incision tool. The introducer was then used to create a subcutaneous tunnel and carefully deploy the device. Local pressure was held to ensure hemostasis.  The incision was closed with SteriStrips and a sterile dressing was applied.  R waves 1.0 mV.  Thurmon FairMihai Keerthi Hazell, MD, [facility] [phone] office [phone] pager 11/26/2016 3:59 PM

## 2016-11-27 ENCOUNTER — Encounter (HOSPITAL_COMMUNITY): Payer: Self-pay | Admitting: Cardiovascular Disease

## 2016-12-01 ENCOUNTER — Telehealth: Payer: Self-pay

## 2016-12-01 NOTE — Telephone Encounter (Signed)
Called patient to schedule a nurse visit for a loop recorder check.  lmtcb

## 2016-12-02 NOTE — Telephone Encounter (Signed)
Patient returned call. Wound check appointment scheduled for tomorrow, 12/03/16 at 12p. Patient verbalized understanding and agreed with plan.  Pt states that she recorded a symptom yesterday. Reported heart burn sensation. Will review Carelink.

## 2016-12-03 ENCOUNTER — Encounter: Payer: Self-pay | Admitting: Physician Assistant

## 2016-12-03 ENCOUNTER — Ambulatory Visit (INDEPENDENT_AMBULATORY_CARE_PROVIDER_SITE_OTHER): Payer: Self-pay | Admitting: *Deleted

## 2016-12-03 DIAGNOSIS — I483 Typical atrial flutter: Secondary | ICD-10-CM

## 2016-12-03 LAB — CUP PACEART INCLINIC DEVICE CHECK
Date Time Interrogation Session: 20180913150042
Implantable Pulse Generator Implant Date: 20180906

## 2016-12-03 NOTE — Progress Notes (Signed)
Wound check in clinic s/p ILR implant. Steri strips removed. Wound well healed without redness or edema. Incision edges approximated. Battery status: Good. R-waves 0.46 mV. 1 symptom episodes, 0 tachy episodes, 0 pause episodes, 0 brady episodes. 2 AF episodes (<0.1% burden). Monthly summary reports and ROV with MCr, 03/02/17 at 8:40a.  Patient reports what she described as "indigestion" on Tuesday evening. Lasted 5-10 minutes. She states that she attempted to report a symptom, but it didn't go through. Instructed patient on how to report a symptom. Advised patient to continue to monitor and to call the office with any additional questions.   Patient verbalized understanding and agreed with plan.  Appointment scheduled with Dr C on 03/02/17 at 0840.

## 2016-12-09 ENCOUNTER — Ambulatory Visit: Payer: Self-pay

## 2016-12-21 ENCOUNTER — Other Ambulatory Visit: Payer: Self-pay

## 2016-12-28 ENCOUNTER — Ambulatory Visit (INDEPENDENT_AMBULATORY_CARE_PROVIDER_SITE_OTHER): Payer: Managed Care, Other (non HMO) | Admitting: *Deleted

## 2016-12-28 DIAGNOSIS — I4892 Unspecified atrial flutter: Secondary | ICD-10-CM

## 2016-12-29 NOTE — Progress Notes (Signed)
Carelink Summary Report / Loop Recorder 

## 2016-12-31 LAB — CUP PACEART REMOTE DEVICE CHECK
Date Time Interrogation Session: 20181006194151
Implantable Pulse Generator Implant Date: 20180906

## 2017-01-15 ENCOUNTER — Telehealth: Payer: Self-pay | Admitting: Cardiovascular Disease

## 2017-01-15 NOTE — Telephone Encounter (Signed)
   [facility] Pre-operative Risk Assessment    Request for surgical clearance:  1. What type of surgery is being performed? Colonoscopy    2. When is this surgery scheduled? 01/27/2017   3. Are there any medications that need to be held prior to surgery and how long? Xarelto    4. Practice name and name of physician performing surgery? Dr. Juanita Craver    5. What is your office phone and fax number? (p) [phone]  (f) [phone]   6. Anesthesia type (None, local, MAC, general) ? Not specified    Tina Robinson 01/15/2017, 7:45 AM  _________________________________________________________________   (provider comments below)

## 2017-01-15 NOTE — Telephone Encounter (Signed)
Patient without any past history of coronary artery disease, her only cardiac history is atrial flutter, will defer to PharmD.

## 2017-01-18 NOTE — Telephone Encounter (Signed)
Pt takes Xarelto for aflutter with CHADS2VASc score of 2 (sex, HTN). Renal function is normal. Ok to hold Xarelto for 1-2 days prior to colonoscopy. Clearance faxed to below number.

## 2017-01-21 ENCOUNTER — Other Ambulatory Visit: Payer: Self-pay | Admitting: Gastroenterology

## 2017-01-21 DIAGNOSIS — R1310 Dysphagia, unspecified: Secondary | ICD-10-CM

## 2017-01-21 NOTE — Progress Notes (Signed)
Tina Wirick MD 

## 2017-01-25 ENCOUNTER — Ambulatory Visit (INDEPENDENT_AMBULATORY_CARE_PROVIDER_SITE_OTHER): Payer: Managed Care, Other (non HMO) | Admitting: *Deleted

## 2017-01-25 DIAGNOSIS — I483 Typical atrial flutter: Secondary | ICD-10-CM | POA: Diagnosis not present

## 2017-01-26 NOTE — Progress Notes (Signed)
Carelink Summary Report / Loop Recorder 

## 2017-01-28 ENCOUNTER — Other Ambulatory Visit: Payer: Self-pay

## 2017-01-28 LAB — CUP PACEART REMOTE DEVICE CHECK
Date Time Interrogation Session: 20181105205250
Implantable Pulse Generator Implant Date: 20180906

## 2017-01-29 ENCOUNTER — Ambulatory Visit
Admission: RE | Admit: 2017-01-29 | Discharge: 2017-01-29 | Disposition: A | Discharge status: Home / Self Care | Payer: Managed Care, Other (non HMO) | Source: Ambulatory Visit | Attending: Gastroenterology | Admitting: Gastroenterology

## 2017-01-29 DIAGNOSIS — R1310 Dysphagia, unspecified: Secondary | ICD-10-CM

## 2017-02-05 ENCOUNTER — Telehealth: Payer: Self-pay

## 2017-02-05 NOTE — Telephone Encounter (Signed)
LVM for pt to call back regarding Dr. Renaye Rakers's recommendation to increase Cardizem to 360mg  daily via result note.

## 2017-02-05 NOTE — Telephone Encounter (Signed)
-----   Message from Thurmon FairMihai Croitoru, MD sent at 01/28/2017  2:54 PM EST ----- Normal implantable loop recorder download. No new clinically significant events. Occasional paroxysmal atrial fibrillation, often with RVR. Some of the symptom episodes are actually normal rhythm, but some correlate with atrial fibrillation. Normal battery status.  Tina Robinson, Please increase her diltiazem SR to 360 mg daily.

## 2017-02-08 MED ORDER — DILTIAZEM HCL ER COATED BEADS 360 MG PO CP24: 360.0000 mg | ORAL_CAPSULE | Freq: Every day | ORAL | 1 refills | Status: DC

## 2017-02-08 NOTE — Telephone Encounter (Signed)
-----   Message from Thurmon FairMihai Croitoru, MD sent at 02/08/2017  9:49 AM EST ----- Absolutely OK

## 2017-02-08 NOTE — Telephone Encounter (Signed)
LVM informing pt that Dr. Salena Saner had ok for her to take 2 180mg  tablets of Cardizem daily, also in voicemail stated that I would send the prescription of the 360mg  Cardizem that Dr. Salena Saner prescribed [facility] [address]

## 2017-02-16 NOTE — Telephone Encounter (Signed)
Patient walked in stating her clearance had not yet been received by Dr. Kenna GilbertMann's office. Reviewed routing history & clearance was sent. Resent clearance note via Epic fax and routed to provider in-basket.   Called patient who states [facility] via fax @ [phone]

## 2017-02-20 ENCOUNTER — Other Ambulatory Visit: Payer: Self-pay | Admitting: Internal Medicine

## 2017-02-24 ENCOUNTER — Ambulatory Visit (INDEPENDENT_AMBULATORY_CARE_PROVIDER_SITE_OTHER): Payer: Managed Care, Other (non HMO) | Admitting: *Deleted

## 2017-02-24 DIAGNOSIS — I483 Typical atrial flutter: Secondary | ICD-10-CM | POA: Diagnosis not present

## 2017-02-25 NOTE — Progress Notes (Signed)
Carelink Summary Report / Loop Recorder 

## 2017-03-02 ENCOUNTER — Encounter: Payer: Managed Care, Other (non HMO) | Admitting: Cardiovascular Disease

## 2017-03-06 LAB — CUP PACEART REMOTE DEVICE CHECK
Date Time Interrogation Session: 20181205204116
Implantable Pulse Generator Implant Date: 20180906

## 2017-03-26 ENCOUNTER — Ambulatory Visit (INDEPENDENT_AMBULATORY_CARE_PROVIDER_SITE_OTHER): Payer: Managed Care, Other (non HMO) | Admitting: *Deleted

## 2017-03-26 DIAGNOSIS — I483 Typical atrial flutter: Secondary | ICD-10-CM | POA: Diagnosis not present

## 2017-03-29 NOTE — Progress Notes (Signed)
Carelink Summary Report / Loop Recorder 

## 2017-04-07 LAB — CUP PACEART REMOTE DEVICE CHECK
Date Time Interrogation Session: 20190104211024
Implantable Pulse Generator Implant Date: 20180906

## 2017-04-26 ENCOUNTER — Ambulatory Visit (INDEPENDENT_AMBULATORY_CARE_PROVIDER_SITE_OTHER): Payer: Managed Care, Other (non HMO) | Admitting: *Deleted

## 2017-04-26 DIAGNOSIS — I483 Typical atrial flutter: Secondary | ICD-10-CM

## 2017-04-26 NOTE — Progress Notes (Signed)
Carelink Summary Reprot / Loop Recorder 

## 2017-04-28 ENCOUNTER — Other Ambulatory Visit: Payer: Self-pay | Admitting: Internal Medicine

## 2017-04-28 ENCOUNTER — Telehealth: Payer: Self-pay | Admitting: Cardiovascular Disease

## 2017-04-28 NOTE — Telephone Encounter (Signed)
Patient calling complaining of not feeling good, feeling tired, SOB with minimal activity, and nausea at times. Patient had a recent loop recorder transmission. Called device clinic, they reported some A. FIB that was not consistent, lasting 2 minutes at a time, and average HR 90's. Patient was due for an appointment back in December. Dr. Royann Shiversroitoru has an appointment open tomorrow. Scheduled patient tomorrow with Dr. Royann Shiversroitoru and encouraged patient to go to urgent care or the ED if symptoms get worse. Patient verbalized understanding.

## 2017-04-28 NOTE — Telephone Encounter (Signed)
Pt c/o Shortness Of Breath: STAT if SOB developed within the last 24 hours or pt is noticeably SOB on the phone  1. Are you currently SOB (can you hear that pt is SOB on the phone)? Yes   2. How long have you been experiencing SOB? 2-3 days off and on  3. Are you SOB when sitting or when up moving around? yes  4. Are you currently experiencing any other symptoms? Yes n/v

## 2017-04-29 ENCOUNTER — Ambulatory Visit (INDEPENDENT_AMBULATORY_CARE_PROVIDER_SITE_OTHER): Payer: Managed Care, Other (non HMO) | Admitting: Cardiovascular Disease

## 2017-04-29 ENCOUNTER — Ambulatory Visit: Payer: Self-pay | Admitting: Cardiovascular Disease

## 2017-04-29 ENCOUNTER — Encounter: Payer: Self-pay | Admitting: Cardiovascular Disease

## 2017-04-29 VITALS — BP 141/87 | HR 72 | Resp 16 | Ht 67.5 in | Wt 234.2 lb

## 2017-04-29 DIAGNOSIS — E669 Obesity, unspecified: Secondary | ICD-10-CM | POA: Diagnosis not present

## 2017-04-29 DIAGNOSIS — Z7901 Long term (current) use of anticoagulants: Secondary | ICD-10-CM | POA: Diagnosis not present

## 2017-04-29 DIAGNOSIS — I483 Typical atrial flutter: Secondary | ICD-10-CM | POA: Diagnosis not present

## 2017-04-29 MED ORDER — RIVAROXABAN 20 MG PO TABS: 20.0000 mg | ORAL_TABLET | Freq: Every day | ORAL | 3 refills | Status: DC

## 2017-04-29 MED ORDER — METOPROLOL TARTRATE 25 MG PO TABS: 25.0000 mg | ORAL_TABLET | Freq: Every day | ORAL | 0 refills | Status: DC | PRN

## 2017-04-29 NOTE — Patient Instructions (Signed)
Dr Royann Shiversroitoru has recommended making the following medication changes: 1. TAKE Metoprolol 25 mg as needed  Your physician recommends that you schedule a follow-up appointment in 12 months. You will receive a reminder letter in the mail two months in advance. If you don't receive a letter, please call our office to schedule the follow-up appointment.  If you need a refill on your cardiac medications before your next appointment, please call your pharmacy.

## 2017-04-29 NOTE — Progress Notes (Signed)
Cardiology Office Note:    Date:  04/30/2017   ID:  Tina Robinson, DOB [birth year], MRN [id number]  PCP:  Patient, No Pcp Per  Cardiologist:  No primary care provider on file.    Referring MD: No ref. provider found   Chief Complaint  Patient presents with  . Shortness of Breath  . Nausea  Follow-up atrial fibrillation  History of Present Illness:    Tina Robinson is a 52 y.o. female with a hx of paroxysmal atrial fibrillation, status post implantable loop recorder, returning for follow-up.  She feels well.  She is really not aware of the palpitations, but occasionally has periods of weakness and malaise that might correlate with the episodes of atrial fibrillation.  1 of these episodes occurred yesterday and lasted for about 15 minutes.  Loop recorder interrogation performed a few days ago shows a low burden of A. fib of only 0.3%, a total of 18 episodes since device implantation.  These are often brief, lasting only for a few minutes, the longest episode was only 2 hours in duration.  Rate control is sometimes quite good with an average rate in the 70s, but some of the episodes have average ventricular rates around 150.  She has not had complaints of syncope, shortness of breath or angina.  She denies leg edema or claudication, focal neurological complaints, bleeding problems on Xarelto.  Past Medical History:  Diagnosis Date  . Atrial flutter, paroxysmal (HCC) 11/04/2016    Past Surgical History:  Procedure Laterality Date  . CESAREAN SECTION    . LOOP RECORDER INSERTION N/A 11/26/2016   Procedure: LOOP RECORDER INSERTION;  Surgeon: Thurmon Fairroitoru, Nicolaos Mitrano, MD;  Location: [facility] INV[facility]IVE [facility];  Service: Cardiovascular;  Laterality: N/A;    Current Medications: Current Meds  Medication Sig  . diltiazem (CARDIZEM CD) 360 MG 24 hr capsule Take 1 capsule (360 mg total) daily by mouth.  . Multiple Vitamin (MULTIVITAMIN WITH MINERALS) TABS tablet Take 1 tablet by mouth daily.  . rivaroxaban  (XARELTO) 20 MG TABS tablet Take 1 tablet (20 mg total) by mouth daily with supper.  . Vitamin D, Ergocalciferol, (DRISDOL) 50000 units CAPS capsule Take 2,000 Units by mouth daily.   . [DISCONTINUED] rivaroxaban (XARELTO) 20 MG TABS tablet Take 1 tablet (20 mg total) by mouth daily with supper.     Allergies:   Mushroom extract complex   Social History   Socioeconomic History  . Marital status: Single    Spouse name: None  . Number of children: None  . Years of education: None  . Highest education level: None  Social Needs  . Financial resource strain: None  . Food insecurity - worry: None  . Food insecurity - inability: None  . Transportation needs - medical: None  . Transportation needs - non-medical: None  Occupational History  . None  Tobacco Use  . Smoking status: Never Smoker  . Smokeless tobacco: Never Used  Substance and Sexual Activity  . Alcohol use: No  . Drug use: No  . Sexual activity: None  Other Topics Concern  . None  Social History Narrative  . None     Family History: The patient's family history includes Hypertension in her mother; Peripheral Artery Disease in her mother.  ROS:   Please see the history of present illness.     All other systems reviewed and are negative.  EKGs/Labs/Other Studies Reviewed:    The following studies were reviewed today: Loop recorder download  EKG:  EKG is  ordered today.  The ekg ordered today demonstrates normal sinus rhythm with a minor intraventricular conduction delay, almost a complete right bundle branch block with a QRS just under 120 ms, QTC 439 ms  Recent Labs: 11/04/2016: ALT 19; Magnesium 2.2; TSH 2.152 11/06/2016: BUN 14; Creatinine, Ser 0.88; Hemoglobin 11.2; Platelets 311; Potassium 3.9; Sodium 140  Recent Lipid Panel    Component Value Date/Time   CHOL 159 11/05/2016 0322   TRIG 65 11/05/2016 0322   HDL 56 11/05/2016 0322   CHOLHDL 2.8 11/05/2016 0322   VLDL 13 11/05/2016 0322   LDLCALC 90  11/05/2016 0322    Physical Exam:    VS:  BP (!) 141/87   Pulse 72   Resp 16   Ht 5' 7.5" (1.715 m)   Wt 234 lb 3.2 oz (106.2 kg)   LMP 01/12/2012   SpO2 100%   BMI 36.14 kg/m     Wt Readings from Last 3 Encounters:  04/29/17 234 lb 3.2 oz (106.2 kg)  11/24/16 228 lb 3.2 oz (103.5 kg)  11/06/16 226 lb 4.8 oz (102.6 kg)     GEN: Moderately obese well nourished, well developed in no acute distress HEENT: Normal NECK: No JVD; No carotid bruits LYMPHATICS: No lymphadenopathy CARDIAC: Healthy loop recorder site, RRR, no murmurs, rubs, gallops RESPIRATORY:  Clear to auscultation without rales, wheezing or rhonchi  ABDOMEN: Soft, non-tender, non-distended MUSCULOSKELETAL:  No edema; No deformity  SKIN: Warm and dry NEUROLOGIC:  Alert and oriented x 3 PSYCHIATRIC:  Normal affect   ASSESSMENT:    1. Typical atrial flutter (HCC)   2. Long term current use of anticoagulant   3. Mild obesity    PLAN:    In order of problems listed above:  1. Paroxysmal atrial flutter and fibrillation: The overall burden of arrhythmia is very low.  Antiarrhythmic medications and/or ablation not justified at this point, future options if necessary based on symptom development.  Most times she has good rate control on the current dose of diltiazem.  Gave her a small dose of metoprolol to use "as needed" for episodes of symptomatic RVR.  Suggested a Kardia device if she wants to independently confirm the arrhythmia, but she should also use the symptom activator on her loop recorder.  Her electrocardiogram from November 04, 2016 suggested typical atrial flutter, but her loop recorder seems to show atrial fibrillation.  Her echo did not show significant structural heart disease.  There was borderline reduction in left and right ventricular function in the setting of tachycardia.  Had a normal stress test last year.   2. Tolerating anticoagulation without serious bleeding complications.   3. Discussed the  importance of weight loss to reduce the burden of arrhythmia.   Medication Adjustments/Labs and Tests Ordered: Current medicines are reviewed at length with the patient today.  Concerns regarding medicines are outlined above.  Orders Placed This Encounter  Procedures  . EKG 12-Lead   Meds ordered this encounter  Medications  . metoprolol tartrate (LOPRESSOR) 25 MG tablet    Sig: Take 1 tablet (25 mg total) by mouth daily as needed.    Dispense:  30 tablet    Refill:  0  . rivaroxaban (XARELTO) 20 MG TABS tablet    Sig: Take 1 tablet (20 mg total) by mouth daily with supper.    Dispense:  90 tablet    Refill:  3    Signed, Thurmon Fair, MD  04/30/2017 7:44 PM    [facility]

## 2017-04-30 ENCOUNTER — Encounter: Payer: Self-pay | Admitting: Cardiovascular Disease

## 2017-05-12 ENCOUNTER — Encounter: Payer: Managed Care, Other (non HMO) | Admitting: Cardiovascular Disease

## 2017-05-18 LAB — CUP PACEART REMOTE DEVICE CHECK
Date Time Interrogation Session: 20190203214053
Implantable Pulse Generator Implant Date: 20180906

## 2017-05-28 ENCOUNTER — Ambulatory Visit (INDEPENDENT_AMBULATORY_CARE_PROVIDER_SITE_OTHER): Payer: Managed Care, Other (non HMO) | Admitting: *Deleted

## 2017-05-28 DIAGNOSIS — I483 Typical atrial flutter: Secondary | ICD-10-CM

## 2017-05-31 NOTE — Progress Notes (Signed)
Carelink Summary Report / Loop Recorder 

## 2017-06-02 ENCOUNTER — Other Ambulatory Visit: Payer: Self-pay | Admitting: Cardiovascular Disease

## 2017-06-16 ENCOUNTER — Telehealth: Payer: Self-pay | Admitting: Cardiology

## 2017-06-16 NOTE — Telephone Encounter (Signed)
Attempted to call pt b/c her home monitor has not updated in at least 14 days. No answer and unable to leave a message.  

## 2017-06-22 ENCOUNTER — Encounter: Payer: Self-pay | Admitting: Cardiology

## 2017-06-22 ENCOUNTER — Telehealth: Payer: Self-pay

## 2017-06-22 NOTE — Telephone Encounter (Signed)
Received letter indicating that she has not consistently filled her Xarelto prescription. Per Theodore Demarkhonda Barrett, PA-C - please call patient to inquire.   lmtcb

## 2017-06-28 NOTE — Telephone Encounter (Signed)
New Message:    Pt is returning call from last week.

## 2017-06-28 NOTE — Telephone Encounter (Signed)
Patient has a refill to pick up today. She reports that prescription costs ~$450. Advised that I'd leave a co-pay assistance card at the front desk for her to pick up today.  Also advised to contact the office if she had continued issues paying for medications. Patient verbalized understanding and agreed with plan.

## 2017-06-30 ENCOUNTER — Ambulatory Visit (INDEPENDENT_AMBULATORY_CARE_PROVIDER_SITE_OTHER): Payer: Managed Care, Other (non HMO) | Admitting: *Deleted

## 2017-06-30 DIAGNOSIS — I483 Typical atrial flutter: Secondary | ICD-10-CM

## 2017-07-01 NOTE — Progress Notes (Signed)
Carelink Summary Report / Loop Recorder 

## 2017-07-08 ENCOUNTER — Encounter: Payer: Self-pay | Admitting: Cardiology

## 2017-07-08 LAB — CUP PACEART REMOTE DEVICE CHECK
Date Time Interrogation Session: 20190308223958
Implantable Pulse Generator Implant Date: 20180906

## 2017-07-26 ENCOUNTER — Telehealth: Payer: Self-pay | Admitting: Cardiovascular Disease

## 2017-07-26 NOTE — Telephone Encounter (Signed)
Pt calling    Pt c/o swelling: STAT is pt has developed SOB within 24 hours  1) How much weight have you gained and in what time span? 2-3 lbs   2) If swelling, where is the swelling located? Feet and ankles  3) Are you currently taking a fluid pill? No  4) Are you currently SOB? No  5) Do you have a log of your daily weights (if so, list)? No  6) Have you gained 3 pounds in a day or 5 pounds in a week? No  7) Have you traveled recently? No  Pt stated this has been going on for 1 week.

## 2017-07-26 NOTE — Telephone Encounter (Signed)
Left a message to call back.

## 2017-07-26 NOTE — Telephone Encounter (Signed)
Patient called back to state that she has been having bilateral lower extremity edema for a week now. She has gained 2 pounds this week but is unsure if this is from the fluid or not. She denies shortness of breath. She stated that is goes down some at night but not all the way. The patient is on her feet all day at work and unable to elevate her legs. She has recently decreased her sodium intake as well. Message routed to the provider for further recommendations.

## 2017-07-27 NOTE — Telephone Encounter (Signed)
Left a message to call back.

## 2017-07-27 NOTE — Telephone Encounter (Signed)
Foot and ankle swelling is a common side effect of diltiazem. 2 lb weight gain is not very worrisome.If the swelling goes down after overnight laying in bed, I would not be concerned. Compression stockings worn during the day would help, as will the sodium restriction. MCr

## 2017-07-29 ENCOUNTER — Telehealth: Payer: Self-pay | Admitting: Cardiology

## 2017-07-29 NOTE — Telephone Encounter (Signed)
Left a message to call back.

## 2017-07-29 NOTE — Telephone Encounter (Signed)
Patient made aware of the recommendations and verbalized her understanding.

## 2017-07-29 NOTE — Telephone Encounter (Signed)
LMOVM requesting that pt send manual transmission b/c home monitor has not updated in at least 14 days.    

## 2017-08-02 ENCOUNTER — Ambulatory Visit (INDEPENDENT_AMBULATORY_CARE_PROVIDER_SITE_OTHER): Payer: Managed Care, Other (non HMO) | Admitting: Cardiology

## 2017-08-02 ENCOUNTER — Other Ambulatory Visit: Payer: Self-pay

## 2017-08-02 ENCOUNTER — Encounter: Payer: Self-pay | Admitting: Cardiology

## 2017-08-02 ENCOUNTER — Ambulatory Visit (INDEPENDENT_AMBULATORY_CARE_PROVIDER_SITE_OTHER): Payer: Managed Care, Other (non HMO) | Admitting: *Deleted

## 2017-08-02 DIAGNOSIS — I483 Typical atrial flutter: Secondary | ICD-10-CM | POA: Diagnosis not present

## 2017-08-02 DIAGNOSIS — R600 Localized edema: Secondary | ICD-10-CM | POA: Diagnosis not present

## 2017-08-02 DIAGNOSIS — I480 Paroxysmal atrial fibrillation: Secondary | ICD-10-CM

## 2017-08-02 DIAGNOSIS — I10 Essential (primary) hypertension: Secondary | ICD-10-CM | POA: Diagnosis not present

## 2017-08-02 DIAGNOSIS — Z9889 Other specified postprocedural states: Secondary | ICD-10-CM | POA: Diagnosis not present

## 2017-08-02 MED ORDER — DILTIAZEM HCL ER COATED BEADS 180 MG PO CP24: 180.0000 mg | ORAL_CAPSULE | Freq: Every day | ORAL | 0 refills | Status: DC

## 2017-08-02 MED ORDER — HYDROCHLOROTHIAZIDE 12.5 MG PO CAPS: 12.5000 mg | ORAL_CAPSULE | Freq: Every day | ORAL | 3 refills | Status: DC

## 2017-08-02 NOTE — Patient Instructions (Signed)
Medication Instructions:  DECREASE Diltizem to  Take 1 tablet once a day START Hydrochlorothiazide 12.5mg  take 1 tablet once a day  Labwork: Your physician recommends that you return for lab work in: 1 day before next appt with Luke-BMET  Testing/Procedures: None   Follow-Up: Your physician recommends that you schedule a follow-up appointment in: 3-4 weeks with Corine Shelter, PA  Any Other Special Instructions Will Be Listed Below (If Applicable). Work note given  If you need a refill on your cardiac medications before your next appointment, please call your pharmacy.

## 2017-08-02 NOTE — Assessment & Plan Note (Signed)
Sept 2018- "low burden of PAF"

## 2017-08-02 NOTE — Assessment & Plan Note (Signed)
B/P up today-echo 8/18- normal LVF

## 2017-08-02 NOTE — Assessment & Plan Note (Signed)
H/O PAF- flutter.

## 2017-08-02 NOTE — Progress Notes (Signed)
Sounds like a plan MCr

## 2017-08-02 NOTE — Progress Notes (Signed)
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 She had a loop recorder placed in Sept 2018. Loop recorder interrogation in Feb showed "low burden" of AF. She walked into the office today complaining of LE edema. This started when her generic Diltiazem brand changed. She also noted her B/P is elevated and she has a headache. She denies orthopnea. She says her HR was 100 earlier  But it's 80 now in the office (NSR).    Current Outpatient Medications  Medication Sig Dispense Refill  . metoprolol tartrate (LOPRESSOR) 25 MG tablet TAKE 1 TABLET (25 MG TOTAL) BY MOUTH DAILY AS NEEDED. 30 tablet 0  . Multiple Vitamin (MULTIVITAMIN WITH MINERALS) TABS tablet Take 1 tablet by mouth daily.    . rivaroxaban (XARELTO) 20 MG TABS tablet Take 1 tablet (20 mg total) by mouth daily with supper. 90 tablet 3  . diltiazem (CARDIZEM CD) 180 MG 24 hr capsule Take 1 capsule (180 mg total) by mouth daily. 30 capsule 0  . hydrochlorothiazide (MICROZIDE) 12.5 MG capsule Take 1 capsule (12.5 mg total) by mouth daily. 30 capsule 3   No current facility-administered medications for this visit.     Allergies  Allergen Reactions  . Mushroom Extract Complex Anaphylaxis    Past Medical History:  Diagnosis Date  . Atrial flutter, paroxysmal (HCC) 11/04/2016    Social History   Socioeconomic History  . Marital status: Single    Spouse name: Not on file  . Number of children: Not on file  . Years of education: Not on file  . Highest education level: Not on file  Occupational History  . Not on file  Social Needs  . Financial resource strain: Not on file  . Food insecurity:    Worry: Not on file    Inability: Not on file  . Transportation needs:    Medical: Not on file    Non-medical: Not on file  Tobacco Use  . Smoking status: Never  Smoker  . Smokeless tobacco: Never Used  Substance and Sexual Activity  . Alcohol use: No  . Drug use: No  . Sexual activity: Not on file  Lifestyle  . Physical activity:    Days per week: Not on file    Minutes per session: Not on file  . Stress: Not on file  Relationships  . Social connections:    Talks on phone: Not on file    Gets together: Not on file    Attends religious service: Not on file    Active member of club or organization: Not on file    Attends meetings of clubs or organizations: Not on file    Relationship status: Not on file  . Intimate partner violence:    Fear of current or ex partner: Not on file    Emotionally abused: Not on file    Physically abused: Not on file    Forced sexual activity: Not on file  Other Topics Concern  . Not on file  Social History Narrative  . Not on file     Family History  Problem Relation Age of Onset  . Peripheral Artery Disease Mother   . Hypertension Mother      Review of Systems: General: negative for chills, fever, night sweats or weight changes.  Cardiovascular: negative for chest pain,  dyspnea on exertion,  orthopnea, palpitations, paroxysmal nocturnal dyspnea or shortness of breath Dermatological: negative for rash Respiratory: negative for cough or wheezing Urologic: negative for hematuria Abdominal: negative for nausea, vomiting, diarrhea, bright red blood per rectum, melena, or hematemesis Neurologic: negative for visual changes, syncope, or dizziness All other systems reviewed and are otherwise negative except as noted above.    Blood pressure (!) 158/92, pulse 79, height 5' 7.5" (1.715 m), weight 236 lb (107 kg), last menstrual period 01/12/2012.  General appearance: alert, cooperative, no distress and moderately obese Neck: no JVD Lungs: clear to auscultation bilaterally Heart: regular rate and rhythm and short systolic murmur AOV Extremities: 1-2 edema Skin: Skin color, texture, turgor normal. No  rashes or lesions Neurologic: Grossly normal  EKG NSR-79  ASSESSMENT AND PLAN:   PAF (paroxysmal atrial fibrillation) (HCC) H/O PAF- flutter.   History of loop recorder Sept 2018- "low burden of PAF"  Essential hypertension B/P up today-echo 8/18- normal LVF  Edema of both feet Reason for today's visit- started after her Diltiazem generic brand changed   PLAN  I suggested we cut her Diltiazem back to 180 mg and add HCTZ 12.5 mg daily. I'll see her back in 1-2 weeks and check a BMP. If her B/P is still up I would consider changing her beta blocker to Coreg for better B/P control. I suggested she take the rest of the day off, start the HCTZ and go home and rest. OK to return to work tomorrow.   Corine Shelter PA-C 08/02/2017 9:31 AM

## 2017-08-02 NOTE — Telephone Encounter (Signed)
Spoke with [facility] and verified per AVS on 08/02/17 pt is taking Cardizem 180 mg daily.

## 2017-08-02 NOTE — Assessment & Plan Note (Signed)
Reason for today's visit- started after her Diltiazem generic brand changed

## 2017-08-03 NOTE — Progress Notes (Signed)
Carelink Summary Report / Loop Recorder 

## 2017-08-04 LAB — CUP PACEART REMOTE DEVICE CHECK
Date Time Interrogation Session: 20190410223731
Implantable Pulse Generator Implant Date: 20180906

## 2017-08-25 LAB — BASIC METABOLIC PANEL
BUN/Creatinine Ratio: 17 (ref 9–23)
BUN: 15 mg/dL (ref 6–24)
CO2: 26 mmol/L (ref 20–29)
Calcium: 9.4 mg/dL (ref 8.7–10.2)
Chloride: 100 mmol/L (ref 96–106)
Creatinine, Ser: 0.86 mg/dL (ref 0.57–1.00)
GFR calc Af Amer: 89 mL/min/{1.73_m2} (ref >59)
GFR calc non Af Amer: 77 mL/min/{1.73_m2} (ref >59)
Glucose: 109 mg/dL — ABNORMAL HIGH (ref 65–99)
Potassium: 3.8 mmol/L (ref 3.5–5.2)
Sodium: 141 mmol/L (ref 134–144)

## 2017-08-25 LAB — CUP PACEART REMOTE DEVICE CHECK
Date Time Interrogation Session: 20190513233713
Implantable Pulse Generator Implant Date: 20180906

## 2017-08-26 ENCOUNTER — Encounter: Payer: Self-pay | Admitting: Cardiology

## 2017-08-26 ENCOUNTER — Ambulatory Visit (INDEPENDENT_AMBULATORY_CARE_PROVIDER_SITE_OTHER): Payer: Managed Care, Other (non HMO) | Admitting: Cardiology

## 2017-08-26 DIAGNOSIS — R600 Localized edema: Secondary | ICD-10-CM

## 2017-08-26 DIAGNOSIS — E669 Obesity, unspecified: Secondary | ICD-10-CM

## 2017-08-26 DIAGNOSIS — I10 Essential (primary) hypertension: Secondary | ICD-10-CM | POA: Diagnosis not present

## 2017-08-26 DIAGNOSIS — I480 Paroxysmal atrial fibrillation: Secondary | ICD-10-CM

## 2017-08-26 NOTE — Progress Notes (Signed)
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 She had a loop recorder placed in Sept 2018. Loop recorder interrogation in Feb showed "low burden" of AF. She is on Xarelto and Diltiazem. She came into the office 08/02/17 with complaints of swelling and headache. Her Diltiazem manufacturer had been changed and she attributed her edema to that. When I saw her she was hypertensive as well. I decreased her Diltiazem and added HCTZ 12.5 mg. She is in the office today for follow up. She feels better, no headache and her LE edema is improved. Repeat B/P by me with large cuff was 128/72.    Current Outpatient Medications  Medication Sig Dispense Refill  . diltiazem (CARDIZEM CD) 180 MG 24 hr capsule Take 1 capsule (180 mg total) by mouth daily. 30 capsule 0  . hydrochlorothiazide (MICROZIDE) 12.5 MG capsule Take 1 capsule (12.5 mg total) by mouth daily. 30 capsule 3  . metoprolol tartrate (LOPRESSOR) 25 MG tablet TAKE 1 TABLET (25 MG TOTAL) BY MOUTH DAILY AS NEEDED. 30 tablet 0  . Multiple Vitamin (MULTIVITAMIN WITH MINERALS) TABS tablet Take 1 tablet by mouth daily.    . rivaroxaban (XARELTO) 20 MG TABS tablet Take 1 tablet (20 mg total) by mouth daily with supper. 90 tablet 3   No current facility-administered medications for this visit.     Allergies  Allergen Reactions  . Mushroom Extract Complex Anaphylaxis    Past Medical History:  Diagnosis Date  . Atrial flutter, paroxysmal (HCC) 11/04/2016    Social History   Socioeconomic History  . Marital status: Single    Spouse name: Not on file  . Number of children: Not on file  . Years of education: Not on file  . Highest education level: Not on file  Occupational History  . Not on file  Social Needs  . Financial resource strain: Not on file  . Food insecurity:    Worry: Not on file    Inability: Not on file  . Transportation needs:    Medical: Not on file    Non-medical: Not on file  Tobacco Use  . Smoking status: Never Smoker  . Smokeless tobacco: Never Used  Substance and Sexual Activity  . Alcohol use: No  . Drug use: No  . Sexual activity: Not on file  Lifestyle  . Physical activity:    Days per week: Not on file    Minutes per session: Not on file  . Stress: Not on file  Relationships  . Social connections:    Talks on phone: Not on file    Gets together: Not on file    Attends religious service: Not on file    Active member of club or organization: Not on file    Attends meetings of clubs or organizations: Not on file    Relationship status: Not on file  . Intimate partner violence:    Fear of current or ex partner: Not on file    Emotionally abused: Not on file    Physically abused: Not on file    Forced sexual activity: Not on file  Other Topics Concern  . Not on file  Social History Narrative  . Not on file     Family History  Problem Relation Age of Onset  . Peripheral Artery Disease Mother   .  Hypertension Mother      Review of Systems: General: negative for chills, fever, night sweats or weight changes.  Cardiovascular: negative for chest pain, dyspnea on exertion, edema, orthopnea, palpitations, paroxysmal nocturnal dyspnea or shortness of breath Dermatological: negative for rash Respiratory: negative for cough or wheezing Urologic: negative for hematuria Abdominal: negative for nausea, vomiting, diarrhea, bright red blood per rectum, melena, or hematemesis Neurologic: negative for visual changes, syncope, or dizziness All other systems reviewed and are otherwise negative except as noted above.    Blood pressure 140/82, pulse 88, height 5' 7.5" (1.715 m), weight 233 lb (105.7 kg), last menstrual period 01/12/2012.  General appearance: alert, cooperative, no distress and mildly obese Neck: no JVD Lungs:  clear to auscultation bilaterally Heart: regular rate and rhythm Extremities: extremities normal, atraumatic, no cyanosis or edema Skin: Skin color, texture, turgor normal. No rashes or lesions Neurologic: Grossly normal   ASSESSMENT AND PLAN:   Edema of both feet Improved on decreased Diltiazem and with the addition of HCTZ 12.5mg   PAF (paroxysmal atrial fibrillation) (HCC) H/O PAF- flutter. Low burden on Loop recorder  Essential hypertension Controlled. Echo Aug 2018 showed normal LVF, normal wall thickness  Obesity (BMI 30-39.9) BMI 35   PLAN  Continue current Rx. BMP from 08/25/17 WNL. We discussed the importance of diet and exercise. She is walking daily-45 minutes. I al;so encouraged her to establish herself with a PCP. We'll see her back in a year.   Corine Shelter PA-C 08/26/2017 8:42 AM

## 2017-08-26 NOTE — Assessment & Plan Note (Signed)
Improved on decreased Diltiazem and with the addition of HCTZ 12.5mg 

## 2017-08-26 NOTE — Assessment & Plan Note (Signed)
BMI 35 

## 2017-08-26 NOTE — Progress Notes (Signed)
Her rate control was poor even on that dose of diltiazem, but the episodes of atrial fibrillation are so brief and so infrequent that I do not think it will be a big deal. MCr

## 2017-08-26 NOTE — Patient Instructions (Signed)
Medication Instructions:  Your physician recommends that you continue on your current medications as directed. Please refer to the Current Medication list given to you today.   Labwork: None   Testing/Procedures: None   Follow-Up: Your physician wants you to follow-up in: 12 months with Dr Croitoru. You will receive a reminder letter in the mail two months in advance. If you don't receive a letter, please call our office to schedule the follow-up appointment.  Any Other Special Instructions Will Be Listed Below (If Applicable).     If you need a refill on your cardiac medications before your next appointment, please call your pharmacy.  

## 2017-08-26 NOTE — Assessment & Plan Note (Signed)
H/O PAF- flutter. Low burden on Loop recorder

## 2017-08-26 NOTE — Assessment & Plan Note (Signed)
Controlled. Echo Aug 2018 showed normal LVF, normal wall thickness

## 2017-09-06 ENCOUNTER — Ambulatory Visit (INDEPENDENT_AMBULATORY_CARE_PROVIDER_SITE_OTHER): Payer: Managed Care, Other (non HMO) | Admitting: *Deleted

## 2017-09-06 DIAGNOSIS — I480 Paroxysmal atrial fibrillation: Secondary | ICD-10-CM | POA: Diagnosis not present

## 2017-09-06 NOTE — Progress Notes (Signed)
Carelink Summary Report / Loop Recorder 

## 2017-09-20 ENCOUNTER — Telehealth: Payer: Self-pay | Admitting: Cardiology

## 2017-09-20 NOTE — Telephone Encounter (Signed)
LMOVM requesting that pt send manual transmission b/c home monitor has not updated in at least 14 days.    

## 2017-10-07 ENCOUNTER — Ambulatory Visit (INDEPENDENT_AMBULATORY_CARE_PROVIDER_SITE_OTHER): Payer: Managed Care, Other (non HMO) | Admitting: *Deleted

## 2017-10-07 DIAGNOSIS — I480 Paroxysmal atrial fibrillation: Secondary | ICD-10-CM

## 2017-10-08 NOTE — Progress Notes (Signed)
Carelink Summary Report / Loop Recorder 

## 2017-10-11 LAB — CUP PACEART REMOTE DEVICE CHECK
Date Time Interrogation Session: 20190616000721
Implantable Pulse Generator Implant Date: 20180906

## 2017-10-28 ENCOUNTER — Telehealth: Payer: Self-pay | Admitting: Cardiovascular Disease

## 2017-10-28 NOTE — Telephone Encounter (Signed)
Spoke with patient. Patient states that she has been out of her Xarelto for 2 weeks because she is unable to afford the prescription.  Patients states that she was given a copay card in April, and with the card the monthly cost is still $265 a month. Patients that she does not qualify for a patient assistance program and she cannot afford the monthly cost.  Provided the patient with 2 weeks of samples and a free 30 day card.  Advised patient that this is not a long term solution, and that she may need to consider switching to a more cost effective medication like Coumadin. Ms. Suzie PortelaMoffitt states that she has a high deductible and the cost of the frequent Coumadin checks might amount to the same as the Xarelto monthly cost.  Advised patient of her risk with interrupting her anti-coag therapy. Recommended she contact her insurance company to inquire if they have a preferred anticoag med that may be on a lower tier and may be more cost effective.  Advised patient that I will forward an update to Dr.Croitoru and we will call back if he has any further recommendations. Patient states that she will call her insurance company and call us back if there is a different preferred medication.

## 2017-10-28 NOTE — Telephone Encounter (Signed)
New Message        Patient calling the office for samples of medication:   1.  What medication and dosage are you requesting samples for?rivaroxaban (XARELTO) 20 MG TABS tablet  2.  Are you currently out of this medication? No , has not taken meds for 2 weeks, could not afford them

## 2017-10-28 NOTE — Telephone Encounter (Signed)
Agree with plan as outlined; needs anticoagulation or risk of CVA higher; check with insurance company to see best option (apixaban or pradaxa may be alternatives to xarelto). Tina MillersBrian Stevan Eberwein

## 2017-11-01 NOTE — Telephone Encounter (Signed)
Called patient - left message to see if she contacted insurance - for lower tier choice

## 2017-11-01 NOTE — Telephone Encounter (Signed)
I suspect the high cost is due to the "doughnut hole" and once she reaches a certain out of pocket cost, her Rx will become more affordable, probably in a couple of months. Will ask our clinical pharmacists to see if that is the case and lok for a solution, please. MCr

## 2017-11-04 ENCOUNTER — Other Ambulatory Visit: Payer: Self-pay | Admitting: Cardiology

## 2017-11-09 ENCOUNTER — Ambulatory Visit (INDEPENDENT_AMBULATORY_CARE_PROVIDER_SITE_OTHER): Payer: Managed Care, Other (non HMO) | Admitting: *Deleted

## 2017-11-09 DIAGNOSIS — I480 Paroxysmal atrial fibrillation: Secondary | ICD-10-CM

## 2017-11-10 NOTE — Progress Notes (Signed)
Carelink Summary Report / Loop Recorder 

## 2017-11-25 LAB — CUP PACEART REMOTE DEVICE CHECK
Date Time Interrogation Session: 20190719004054
Implantable Pulse Generator Implant Date: 20180906

## 2017-12-07 ENCOUNTER — Ambulatory Visit (INDEPENDENT_AMBULATORY_CARE_PROVIDER_SITE_OTHER): Payer: Managed Care, Other (non HMO) | Admitting: Physician Assistant

## 2017-12-07 ENCOUNTER — Encounter: Payer: Self-pay | Admitting: Physician Assistant

## 2017-12-07 VITALS — BP 143/96 | HR 79 | Resp 16 | Ht 67.5 in | Wt 226.4 lb

## 2017-12-07 DIAGNOSIS — I480 Paroxysmal atrial fibrillation: Secondary | ICD-10-CM

## 2017-12-07 DIAGNOSIS — R5383 Other fatigue: Secondary | ICD-10-CM

## 2017-12-07 DIAGNOSIS — I10 Essential (primary) hypertension: Secondary | ICD-10-CM

## 2017-12-07 DIAGNOSIS — R0609 Other forms of dyspnea: Secondary | ICD-10-CM | POA: Diagnosis not present

## 2017-12-07 DIAGNOSIS — R0600 Dyspnea, unspecified: Secondary | ICD-10-CM

## 2017-12-07 DIAGNOSIS — Z79899 Other long term (current) drug therapy: Secondary | ICD-10-CM

## 2017-12-07 NOTE — Progress Notes (Signed)
Cardiology Office Note   Date:  12/07/2017   ID:  Tina Robinson, DOB 1965-10-17, MRN [id number]  PCP:  Patient, No Pcp Per  Cardiologist:  Dr Royann Shiversroitoru, 04/29/2017 L. Diona FantiKilroy, [facility]  08/26/2017 Theodore Demarkhonda Annastasia Haskins, PA-C   Chief Complaint  Patient presents with  . office visit    feeling fatgiue with normal daily activites, chest pains, SOB     History of Present Illness: Tina HaberCarol Persing is a 52 y.o. female with a history of PAF s/p ILR, nl EF on echo and ETT 10/2016   Tina Haberarol Reichow presents for cardiology follow up.   No documented hx HTN, but BP has been higher than usual for her.  She is on triple drug therapy, I will will add a diagnosis of hypertension.  She no longer exercises, stopped when it got hot. Averages > 10,000 steps at work daily.   The flutters are better on the Cardizem and the metoprolol. She takes meds w/ food has not had the Cardizem and HCTZ this am.  Last week, she was walking downtown and had DOE, fairly sudden onset, out of proportion to the exertion. Relieved by rest in a few minutes.   She has had this total of 4 times, the last one this am, while brushing her teeth. Always relieved by rest in a few minutes. The second episode SOB.   Third episode was associated w/ chest pressure, 9/10, no change w/ deep inspiration, also felt a sharpness when she moved. No rx tried. Once again, relieved by rest in < 5 min.   Most recent episode was today, while brushing her teeth.   Episodes always associated w/ fatigue and some weakness.   She does not think she has had atrial fibrillation during the episodes. Date/time of the episodes will be recorded and compared to information from her loop recorder. 1.  9/15, 2:00 PM 2.  9/16, 3:15 PM 3.  9/16, 9:00 PM, patient self triggered recording. 4.  9/17, 6:30-7:00 AM   Past Medical History:  Diagnosis Date  . Atrial flutter, paroxysmal (HCC) 11/04/2016  . Benign essential HTN     Past Surgical History:  Procedure  Laterality Date  . CESAREAN SECTION    . LOOP RECORDER INSERTION N/A 11/26/2016   Procedure: LOOP RECORDER INSERTION;  Surgeon: Thurmon Fairroitoru, Mihai, MD;  Location: [facility];  Service: Cardiovascular;  Laterality: N/A;    Current Outpatient Medications  Medication Sig Dispense Refill  . diltiazem (CARDIZEM CD) 180 MG 24 hr capsule TAKE ONE CAPSULE EVERY DAY 30 capsule 11  . Multiple Vitamin (MULTIVITAMIN WITH MINERALS) TABS tablet Take 1 tablet by mouth daily.    . rivaroxaban (XARELTO) 20 MG TABS tablet Take 1 tablet (20 mg total) by mouth daily with supper. (Patient taking differently: Take 20 mg by mouth daily with supper. ) 90 tablet 3  . Cholecalciferol (VITAMIN D3) 50000 units CAPS cholecalciferol (vitamin D3) 50,000 unit capsule  Take 1 capsule po weekly for 8 weeks    . hydrochlorothiazide (MICROZIDE) 12.5 MG capsule Take 1 capsule (12.5 mg total) by mouth daily. 30 capsule 3  . metoprolol tartrate (LOPRESSOR) 25 MG tablet TAKE 1 TABLET (25 MG TOTAL) BY MOUTH DAILY AS NEEDED. 30 tablet 0   No current facility-administered medications for this visit.     Allergies:   Mushroom extract complex    Social History:  The patient  reports that she has never smoked. She has never used smokeless tobacco. She reports that she does  not drink alcohol or use drugs.   Family History:  The patient's family history includes Hypertension in her father and mother; Peripheral Artery Disease in her mother; Stroke (age of onset: 22) in her father.    ROS:  Please see the history of present illness. All other systems are reviewed and negative.    PHYSICAL EXAM: VS:  BP (!) 143/96 (BP Location: Right Arm, Patient Position: Sitting, Cuff Size: Normal)   Pulse 79   Resp 16   Ht 5' 7.5" (1.715 m)   Wt 226 lb 6.4 oz (102.7 kg)   LMP 01/12/2012   SpO2 100%   BMI 34.94 kg/m  , BMI Body mass index is 34.94 kg/m. GEN: Well nourished, well developed, female in no acute distress  HEENT: normal for  age  Neck: no JVD, no carotid bruit, no masses Cardiac: RRR; no murmur, no rubs, or gallops Respiratory:  clear to auscultation bilaterally, normal work of breathing GI: soft, nontender, nondistended, + BS MS: no deformity or atrophy; no edema; distal pulses are 2+ in all 4 extremities   Skin: warm and dry, no rash Neuro:  Strength and sensation are intact Psych: euthymic mood, full affect   EKG:  EKG is ordered today. The ekg ordered today demonstrates sinus rhythm, heart rate 69, no significant change from previous ECG, normal intervals, no acute ischemic changes   Recent Labs: 08/25/2017: BUN 15; Creatinine, Ser 0.86; Potassium 3.8; Sodium 141    Lipid Panel    Component Value Date/Time   CHOL 159 11/05/2016 0322   TRIG 65 11/05/2016 0322   HDL 56 11/05/2016 0322   CHOLHDL 2.8 11/05/2016 0322   VLDL 13 11/05/2016 0322   LDLCALC 90 11/05/2016 0322     Wt Readings from Last 3 Encounters:  12/07/17 226 lb 6.4 oz (102.7 kg)  08/26/17 233 lb (105.7 kg)  08/02/17 236 lb (107 kg)     Other studies Reviewed: Additional studies/ records that were reviewed today include: Office notes and testing.  ASSESSMENT AND PLAN:  1.  Dyspnea on exertion: She was mildly anemic on her last CBC, recheck today.   - To make sure that the dyspnea on exertion is not an anginal equivalent, exercise treadmill test - To make sure that the dyspnea on exertion is not secondary to any structural abnormality, check an echocardiogram  2.  Fatigue: In addition to the CBC, check a TSH  3.  General medical screening, medication management: Check liver functions and a lipid profile  4.  PAF: Her device was interrogated, the interrogation report was reviewed. -She has had 11 episodes of atrial fibrillation since 12/03/2017.  However, most of them were on 9/16 and none lasted that long. - Dates and times of the patient's reported episodes of shortness of breath were reviewed, they do not correlate with  atrial fibrillation except for the manually triggered event on 9/16. -The other episodes of dyspnea on exertion were not associated with atrial fibrillation.  -Dr. Royann Shivers to review and advise any med changes  5.  Hypertension: -Her blood pressure is elevated today, but she states she has not had her Cardizem and her HCTZ because she has not eaten yet. -She is asked to follow her blood pressure and let us know readings when she has taken her medications.   Current medicines are reviewed at length with the patient today.  The patient does not have concerns regarding medicines.  The following changes have been made:  no change  Labs/  tests ordered today include:   Orders Placed This Encounter  Procedures  . Comprehensive metabolic panel  . CBC  . Lipid panel  . TSH  . EXERCISE TOLERANCE TEST (ETT)  . ECHOCARDIOGRAM COMPLETE     Disposition:   FU with Dr Royann Shivers  Signed, Theodore Demark, PA-C  12/07/2017 11:13 AM    [facility] Phone: [phone]; Fax: [phone]  This note was written with the assistance of speech recognition software. Please excuse any transcriptional errors.

## 2017-12-07 NOTE — Patient Instructions (Signed)
Medication Instructions:  No Changes. If you need a refill on your cardiac medications before your next appointment, please call your pharmacy.  Labwork: CBC, CMET, TSH, Lipid HERE IN OUR OFFICE AT [facility]  Take the provided lab slips with you to the lab for your blood draw.   Testing/Procedures: Your physician has requested that you have an exercise tolerance test. For further information please visit [email or web address]. Please also follow instruction sheet, as given.  Your physician has requested that you have an echocardiogram. Echocardiography is a painless test that uses sound waves to create images of your heart. It provides your doctor with information about the size and shape of your heart and how well your heart's chambers and valves are working. This procedure takes approximately one hour. There are no restrictions for this procedure.  Follow-Up: Your physician wants you to follow-up with Dr.Croitoru first available after tests are completed. If needed schedule with Corine ShelterLuke Kilroy PA, or Theodore Demarkhonda Barrett, [place].    Thank you for choosing [facility]!!

## 2017-12-08 ENCOUNTER — Other Ambulatory Visit (INDEPENDENT_AMBULATORY_CARE_PROVIDER_SITE_OTHER): Payer: Managed Care, Other (non HMO)

## 2017-12-08 DIAGNOSIS — I480 Paroxysmal atrial fibrillation: Secondary | ICD-10-CM | POA: Diagnosis not present

## 2017-12-08 DIAGNOSIS — R079 Chest pain, unspecified: Secondary | ICD-10-CM | POA: Diagnosis not present

## 2017-12-08 LAB — TSH: TSH: 1.63 u[IU]/mL (ref 0.450–4.500)

## 2017-12-08 LAB — CBC
Hematocrit: 35.9 % (ref 34.0–46.6)
Hemoglobin: 12.1 g/dL (ref 11.1–15.9)
MCH: 28.7 pg (ref 26.6–33.0)
MCHC: 33.7 g/dL (ref 31.5–35.7)
MCV: 85 fL (ref 79–97)
Platelets: 326 10*3/uL (ref 150–450)
RBC: 4.21 x10E6/uL (ref 3.77–5.28)
RDW: 14.2 % (ref 12.3–15.4)
WBC: 7 10*3/uL (ref 3.4–10.8)

## 2017-12-08 LAB — LIPID PANEL
Chol/HDL Ratio: 3 ratio (ref 0.0–4.4)
Cholesterol, Total: 180 mg/dL (ref 100–199)
HDL: 61 mg/dL (ref >39)
LDL Calculated: 99 mg/dL (ref 0–99)
Triglycerides: 99 mg/dL (ref 0–149)
VLDL Cholesterol Cal: 20 mg/dL (ref 5–40)

## 2017-12-08 LAB — COMPREHENSIVE METABOLIC PANEL
ALT: 17 IU/L (ref 0–32)
AST: 21 IU/L (ref 0–40)
Albumin/Globulin Ratio: 1.4 (ref 1.2–2.2)
Albumin: 4.3 g/dL (ref 3.5–5.5)
Alkaline Phosphatase: 94 IU/L (ref 39–117)
BUN/Creatinine Ratio: 16 (ref 9–23)
BUN: 14 mg/dL (ref 6–24)
Bilirubin Total: 0.4 mg/dL (ref 0.0–1.2)
CO2: 23 mmol/L (ref 20–29)
Calcium: 9.4 mg/dL (ref 8.7–10.2)
Chloride: 103 mmol/L (ref 96–106)
Creatinine, Ser: 0.87 mg/dL (ref 0.57–1.00)
GFR calc Af Amer: 87 mL/min/{1.73_m2} (ref >59)
GFR calc non Af Amer: 76 mL/min/{1.73_m2} (ref >59)
Globulin, Total: 3.1 g/dL (ref 1.5–4.5)
Glucose: 80 mg/dL (ref 65–99)
Potassium: 4.2 mmol/L (ref 3.5–5.2)
Sodium: 143 mmol/L (ref 134–144)
Total Protein: 7.4 g/dL (ref 6.0–8.5)

## 2017-12-09 ENCOUNTER — Telehealth (HOSPITAL_COMMUNITY): Payer: Self-pay

## 2017-12-09 NOTE — Telephone Encounter (Signed)
Encounter complete. 

## 2017-12-13 ENCOUNTER — Other Ambulatory Visit: Payer: Self-pay | Admitting: Physician Assistant

## 2017-12-13 ENCOUNTER — Ambulatory Visit (INDEPENDENT_AMBULATORY_CARE_PROVIDER_SITE_OTHER): Payer: Managed Care, Other (non HMO) | Admitting: *Deleted

## 2017-12-13 DIAGNOSIS — I480 Paroxysmal atrial fibrillation: Secondary | ICD-10-CM

## 2017-12-13 DIAGNOSIS — R0600 Dyspnea, unspecified: Secondary | ICD-10-CM

## 2017-12-13 DIAGNOSIS — R0609 Other forms of dyspnea: Principal | ICD-10-CM

## 2017-12-13 LAB — CUP PACEART REMOTE DEVICE CHECK
Date Time Interrogation Session: 20190821013602
Implantable Pulse Generator Implant Date: 20180906

## 2017-12-13 NOTE — Progress Notes (Signed)
Carelink Summary Report / Loop Recorder 

## 2017-12-14 ENCOUNTER — Inpatient Hospital Stay (HOSPITAL_COMMUNITY): Admission: RE | Admit: 2017-12-14 | Payer: Managed Care, Other (non HMO) | Source: Ambulatory Visit

## 2017-12-14 ENCOUNTER — Ambulatory Visit (HOSPITAL_COMMUNITY): Payer: Managed Care, Other (non HMO) | Attending: Cardiology

## 2017-12-14 ENCOUNTER — Other Ambulatory Visit: Payer: Self-pay

## 2017-12-14 DIAGNOSIS — E669 Obesity, unspecified: Secondary | ICD-10-CM | POA: Diagnosis not present

## 2017-12-14 DIAGNOSIS — Z6834 Body mass index (BMI) 34.0-34.9, adult: Secondary | ICD-10-CM | POA: Diagnosis not present

## 2017-12-14 DIAGNOSIS — I340 Nonrheumatic mitral (valve) insufficiency: Secondary | ICD-10-CM | POA: Insufficient documentation

## 2017-12-14 DIAGNOSIS — I4891 Unspecified atrial fibrillation: Secondary | ICD-10-CM | POA: Diagnosis not present

## 2017-12-14 DIAGNOSIS — R5383 Other fatigue: Secondary | ICD-10-CM

## 2017-12-14 DIAGNOSIS — R0600 Dyspnea, unspecified: Secondary | ICD-10-CM

## 2017-12-14 DIAGNOSIS — Z79899 Other long term (current) drug therapy: Secondary | ICD-10-CM | POA: Insufficient documentation

## 2017-12-14 DIAGNOSIS — R600 Localized edema: Secondary | ICD-10-CM | POA: Diagnosis not present

## 2017-12-14 DIAGNOSIS — R0609 Other forms of dyspnea: Secondary | ICD-10-CM | POA: Insufficient documentation

## 2017-12-14 DIAGNOSIS — I10 Essential (primary) hypertension: Secondary | ICD-10-CM | POA: Insufficient documentation

## 2017-12-20 LAB — CUP PACEART REMOTE DEVICE CHECK
Date Time Interrogation Session: 20190923024102
Implantable Pulse Generator Implant Date: 20180906

## 2017-12-22 ENCOUNTER — Telehealth: Payer: Self-pay

## 2017-12-22 NOTE — Telephone Encounter (Signed)
-----   Message from Darrol Jump, PA-C sent at 12/21/2017  4:53 PM EDT ----- Regarding: RE: wants something cheaper Coumadin is cheaper, but it is a lot more trouble. Has she looked into the patient assistance program? Sometimes, Eliquis is easier to get assistance for. If she does not feel she could get assistance for Xarelto, have her look up Eliquis online and see if she qualifies for their assistance program. Thanks  ----- Message ----- From: Lucita Ferrara, CMA Sent: 12/17/2017   4:05 PM EDT To: Darrol Jump, PA-C Subject: wants something cheaper                        Patient states the Xarelto is too expensive and wants to know what she can take that is the equivalent.   Thanks,  [employer]

## 2017-12-22 NOTE — Telephone Encounter (Signed)
Left detailed message advising patient of Rhonda's recommendations. Advised patient to contact office with any questions or concerns.

## 2018-01-14 ENCOUNTER — Ambulatory Visit (INDEPENDENT_AMBULATORY_CARE_PROVIDER_SITE_OTHER): Payer: Managed Care, Other (non HMO) | Admitting: *Deleted

## 2018-01-14 DIAGNOSIS — I480 Paroxysmal atrial fibrillation: Secondary | ICD-10-CM

## 2018-01-15 NOTE — Progress Notes (Signed)
Carelink Summary Report / Loop Recorder 

## 2018-01-28 LAB — CUP PACEART REMOTE DEVICE CHECK
Date Time Interrogation Session: 20191026041027
Implantable Pulse Generator Implant Date: 20180906

## 2018-01-29 ENCOUNTER — Encounter (HOSPITAL_COMMUNITY): Payer: Self-pay | Admitting: Emergency Medicine

## 2018-01-29 ENCOUNTER — Emergency Department (HOSPITAL_COMMUNITY)
Admission: EM | Admit: 2018-01-29 | Discharge: 2018-01-29 | Disposition: A | Discharge status: Home / Self Care | Payer: Managed Care, Other (non HMO) | Attending: Emergency Medicine | Admitting: Emergency Medicine

## 2018-01-29 ENCOUNTER — Emergency Department (HOSPITAL_COMMUNITY): Payer: Managed Care, Other (non HMO)

## 2018-01-29 DIAGNOSIS — Z79899 Other long term (current) drug therapy: Secondary | ICD-10-CM | POA: Insufficient documentation

## 2018-01-29 DIAGNOSIS — R42 Dizziness and giddiness: Secondary | ICD-10-CM | POA: Insufficient documentation

## 2018-01-29 DIAGNOSIS — R002 Palpitations: Secondary | ICD-10-CM | POA: Diagnosis not present

## 2018-01-29 DIAGNOSIS — R079 Chest pain, unspecified: Secondary | ICD-10-CM | POA: Insufficient documentation

## 2018-01-29 DIAGNOSIS — R0602 Shortness of breath: Secondary | ICD-10-CM | POA: Insufficient documentation

## 2018-01-29 DIAGNOSIS — Z7901 Long term (current) use of anticoagulants: Secondary | ICD-10-CM | POA: Insufficient documentation

## 2018-01-29 DIAGNOSIS — R110 Nausea: Secondary | ICD-10-CM | POA: Insufficient documentation

## 2018-01-29 DIAGNOSIS — I10 Essential (primary) hypertension: Secondary | ICD-10-CM | POA: Insufficient documentation

## 2018-01-29 LAB — CBC
HCT: 36.4 % (ref 36.0–46.0)
Hemoglobin: 10.6 g/dL — ABNORMAL LOW (ref 12.0–15.0)
MCH: 26.6 pg (ref 26.0–34.0)
MCHC: 29.1 g/dL — ABNORMAL LOW (ref 30.0–36.0)
MCV: 91.2 fL (ref 80.0–100.0)
Platelets: 315 10*3/uL (ref 150–400)
RBC: 3.99 MIL/uL (ref 3.87–5.11)
RDW: 13.1 % (ref 11.5–15.5)
WBC: 9 10*3/uL (ref 4.0–10.5)
nRBC: 0 % (ref 0.0–0.2)

## 2018-01-29 LAB — I-STAT TROPONIN, ED
Troponin i, poc: 0 ng/mL (ref 0.00–0.08)
Troponin i, poc: 0 ng/mL (ref 0.00–0.08)

## 2018-01-29 LAB — BASIC METABOLIC PANEL
Anion gap: 7 (ref 5–15)
BUN: 10 mg/dL (ref 6–20)
CO2: 26 mmol/L (ref 22–32)
Calcium: 9 mg/dL (ref 8.9–10.3)
Chloride: 107 mmol/L (ref 98–111)
Creatinine, Ser: 0.82 mg/dL (ref 0.44–1.00)
GFR calc Af Amer: >60 mL/min (ref >60)
GFR calc non Af Amer: >60 mL/min (ref >60)
Glucose, Bld: 117 mg/dL — ABNORMAL HIGH (ref 70–99)
Potassium: 3.5 mmol/L (ref 3.5–5.1)
Sodium: 140 mmol/L (ref 135–145)

## 2018-01-29 NOTE — ED Triage Notes (Signed)
Pt arrived POV with sister, pt states weird feeling woke her up with heaviness in chest, pt felt dizzy, walked to bathroom, became nauseated, shob with R arm pain radiating to R neck.  Pt does have loop recorder for afib/flutter.

## 2018-01-29 NOTE — ED Notes (Signed)
Loop Recorder interrogated.  

## 2018-01-29 NOTE — ED Provider Notes (Signed)
[facility] EMERGENCY DEPARTMENT Provider Note   CSN: [id number] Arrival date & time: 01/29/18  0052     History   Chief Complaint Chief Complaint  Patient presents with  . Palpitations    HPI Tina Robinson is a 52 y.o. female.  Patient reports that she woke up tonight and felt like she was having a heaviness in her chest with dizziness.  She thought it might be vertigo.  She got up to go to the bathroom, reports that she felt short of breath and nausea.  She then noticed that she was experiencing a pain in the right side of her neck and shoulder, going down her right arm.  Symptoms have improved but are not completely resolved at time of arrival to the ER.     Past Medical History:  Diagnosis Date  . Atrial flutter, paroxysmal (HCC) 11/04/2016  . Benign essential HTN     Patient Active Problem List   Diagnosis Date Noted  . Obesity (BMI 30-39.9) 08/26/2017  . History of loop recorder 08/02/2017  . Essential hypertension 08/02/2017  . Edema of both feet 08/02/2017  . Shortness of breath 11/12/2016  . Right-sided chest pain 11/12/2016  . Chest pain, rule out acute myocardial infarction   . PAF (paroxysmal atrial fibrillation) (HCC) 11/04/2016  . Leg cramping 11/04/2016    Past Surgical History:  Procedure Laterality Date  . CESAREAN SECTION    . LOOP RECORDER INSERTION N/A 11/26/2016   Procedure: LOOP RECORDER INSERTION;  Surgeon: Thurmon Fair, MD;  Location: [facility] INVASIVE CV LAB;  Service: Cardiovascular;  Laterality: N/A;     OB History   None      Home Medications    Prior to Admission medications   Medication Sig Start Date End Date Taking? Authorizing Provider  Cholecalciferol (VITAMIN D3) 50000 units CAPS cholecalciferol (vitamin D3) 50,000 unit capsule  Take 1 capsule po weekly for 8 weeks    [provider]  diltiazem (CARDIZEM CD) 180 MG 24 hr capsule TAKE ONE CAPSULE EVERY DAY 11/04/17   Croitoru, Mihai, MD    hydrochlorothiazide (MICROZIDE) 12.5 MG capsule Take 1 capsule (12.5 mg total) by mouth daily. 08/02/17 11/30/17  Abelino Derrick, PA-C  metoprolol tartrate (LOPRESSOR) 25 MG tablet TAKE 1 TABLET (25 MG TOTAL) BY MOUTH DAILY AS NEEDED. 06/02/17 08/31/17  Croitoru, Mihai, MD  Multiple Vitamin (MULTIVITAMIN WITH MINERALS) TABS tablet Take 1 tablet by mouth daily.    [provider]  rivaroxaban (XARELTO) 20 MG TABS tablet Take 1 tablet (20 mg total) by mouth daily with supper. Patient taking differently: Take 20 mg by mouth daily with supper.  04/29/17   Croitoru, Rachelle Hora, MD    Family History Family History  Problem Relation Age of Onset  . Peripheral Artery Disease Mother        amputee  . Hypertension Mother   . Hypertension Father   . Stroke Father 56    Social History Social History   Tobacco Use  . Smoking status: Never Smoker  . Smokeless tobacco: Never Used  Substance Use Topics  . Alcohol use: No  . Drug use: No     Allergies   Mushroom extract complex   Review of Systems Review of Systems  Respiratory: Positive for shortness of breath.   Cardiovascular: Positive for chest pain and palpitations.  Gastrointestinal: Positive for nausea.  All other systems reviewed and are negative.    Physical Exam Updated Vital Signs BP 123/83   Pulse  70   Temp 97.8 F (36.6 C) (Oral)   Resp 16   LMP 01/12/2012   SpO2 99%   Physical Exam  Constitutional: She is oriented to person, place, and time. She appears well-developed and well-nourished. No distress.  HENT:  Head: Normocephalic and atraumatic.  Right Ear: Hearing normal.  Left Ear: Hearing normal.  Nose: Nose normal.  Mouth/Throat: Oropharynx is clear and moist and mucous membranes are normal.  Eyes: Pupils are equal, round, and reactive to light. Conjunctivae and EOM are normal.  Neck: Normal range of motion. Neck supple.  Cardiovascular: Regular rhythm, S1 normal and S2 normal. Exam reveals no gallop and  no friction rub.  No murmur heard. Pulmonary/Chest: Effort normal and breath sounds normal. No respiratory distress. She exhibits no tenderness.  Abdominal: Soft. Normal appearance and bowel sounds are normal. There is no hepatosplenomegaly. There is no tenderness. There is no rebound, no guarding, no tenderness at McBurney's point and negative Murphy's sign. No hernia.  Musculoskeletal: Normal range of motion.  Neurological: She is alert and oriented to person, place, and time. She has normal strength. No cranial nerve deficit or sensory deficit. Coordination normal. GCS eye subscore is 4. GCS verbal subscore is 5. GCS motor subscore is 6.  Skin: Skin is warm, dry and intact. No rash noted. No cyanosis.  Psychiatric: She has a normal mood and affect. Her speech is normal and behavior is normal. Thought content normal.  Nursing note and vitals reviewed.    ED Treatments / Results  Labs (all labs ordered are listed, but only abnormal results are displayed) Labs Reviewed  BASIC METABOLIC PANEL - Abnormal; Notable for the following components:      Result Value   Glucose, Bld 117 (*)    All other components within normal limits  CBC - Abnormal; Notable for the following components:   Hemoglobin 10.6 (*)    MCHC 29.1 (*)    All other components within normal limits  I-STAT TROPONIN, ED  I-STAT TROPONIN, ED    EKG None  Radiology Dg Chest 2 View  Result Date: 01/29/2018 CLINICAL DATA:  Acute onset of generalized chest heaviness and shortness of breath. Nausea. Right arm pain radiating to the right neck. EXAM: CHEST - 2 VIEW COMPARISON:  Chest radiograph performed 11/04/2016 FINDINGS: The lungs are well-aerated and clear. There is no evidence of focal opacification, pleural effusion or pneumothorax. The heart is mildly enlarged. A loop recorder is noted. No acute osseous abnormalities are seen. IMPRESSION: Mild cardiomegaly. Lungs remain grossly clear. Electronically Signed   By: Roanna Raider M.D.   On: 01/29/2018 02:07    Procedures Procedures (including critical care time)  Medications Ordered in ED Medications - No data to display   Initial Impression / Assessment and Plan / ED Course  I have reviewed the triage vital signs and the nursing notes.  Pertinent labs & imaging results that were available during my care of the patient were reviewed by me and considered in my medical decision making (see chart for details).     Contacted by [employer].  Interrogation reveals that the patient has had 9 A. fib/a flutter events since December 11, 2016.  The most recent was on November 3, no arrhythmia or recorded events tonight.  Patient presented to the ER for evaluation of heart palpitations, chest discomfort and dizziness.  Patient has a history of atrial flutter.  She is on Xarelto.  No tachycardia, hypoxia or dyspnea, doubt PE.  Patient has  an implanted loop recorder, this was interrogated and she did not have any arrhythmia tonight.  Patient's EKG unremarkable.  Troponin negative x2.  Patient has improved, no current symptoms.  She is therefore felt appropriate for discharge and outpatient follow-up with her cardiologist.  Final Clinical Impressions(s) / ED Diagnoses   Final diagnoses:  Heart palpitations  Chest pain, unspecified type    ED Discharge Orders    None       Torez Beauregard, Canary Brim, MD 01/29/18 [phone]

## 2018-02-09 ENCOUNTER — Ambulatory Visit (HOSPITAL_COMMUNITY): Payer: Managed Care, Other (non HMO)

## 2018-02-21 ENCOUNTER — Ambulatory Visit (INDEPENDENT_AMBULATORY_CARE_PROVIDER_SITE_OTHER): Payer: Managed Care, Other (non HMO)

## 2018-02-21 DIAGNOSIS — I480 Paroxysmal atrial fibrillation: Secondary | ICD-10-CM | POA: Diagnosis not present

## 2018-02-21 NOTE — Progress Notes (Signed)
Carelink Summary Report / Loop Recorder 

## 2018-03-21 LAB — CUP PACEART REMOTE DEVICE CHECK
Date Time Interrogation Session: 20191128044215
Implantable Pulse Generator Implant Date: 20180906

## 2018-03-22 ENCOUNTER — Ambulatory Visit: Payer: Managed Care, Other (non HMO)

## 2018-03-22 DIAGNOSIS — I480 Paroxysmal atrial fibrillation: Secondary | ICD-10-CM

## 2018-03-22 LAB — CUP PACEART REMOTE DEVICE CHECK
Date Time Interrogation Session: 20191128044215
Date Time Interrogation Session: 20191231111056
Implantable Pulse Generator Implant Date: 20180906
Implantable Pulse Generator Implant Date: 20180906

## 2018-03-22 NOTE — Progress Notes (Signed)
Carelink Summary Report / Loop Recorder 

## 2018-04-25 ENCOUNTER — Ambulatory Visit (INDEPENDENT_AMBULATORY_CARE_PROVIDER_SITE_OTHER): Payer: Self-pay

## 2018-04-25 DIAGNOSIS — I480 Paroxysmal atrial fibrillation: Secondary | ICD-10-CM

## 2018-04-28 LAB — CUP PACEART REMOTE DEVICE CHECK
Date Time Interrogation Session: 20200202110559
Implantable Pulse Generator Implant Date: 20180906

## 2018-05-04 NOTE — Progress Notes (Signed)
Carelink Summary Report / Loop Recorder 

## 2018-05-27 ENCOUNTER — Ambulatory Visit (INDEPENDENT_AMBULATORY_CARE_PROVIDER_SITE_OTHER): Payer: Self-pay | Admitting: *Deleted

## 2018-05-27 DIAGNOSIS — I480 Paroxysmal atrial fibrillation: Secondary | ICD-10-CM

## 2018-05-28 LAB — CUP PACEART REMOTE DEVICE CHECK
Date Time Interrogation Session: 20200306110605
Implantable Pulse Generator Implant Date: 20180906

## 2018-06-03 NOTE — Progress Notes (Signed)
Carelink Summary Report / Loop Recorder 

## 2018-06-13 ENCOUNTER — Emergency Department (HOSPITAL_COMMUNITY): Payer: BLUE CROSS/BLUE SHIELD

## 2018-06-13 ENCOUNTER — Emergency Department (HOSPITAL_COMMUNITY)
Admission: EM | Admit: 2018-06-13 | Discharge: 2018-06-13 | Disposition: A | Discharge status: Home / Self Care | Payer: BLUE CROSS/BLUE SHIELD | Attending: Emergency Medicine | Admitting: Emergency Medicine

## 2018-06-13 ENCOUNTER — Other Ambulatory Visit: Payer: Self-pay

## 2018-06-13 DIAGNOSIS — R05 Cough: Secondary | ICD-10-CM

## 2018-06-13 DIAGNOSIS — R509 Fever, unspecified: Secondary | ICD-10-CM | POA: Diagnosis not present

## 2018-06-13 DIAGNOSIS — I10 Essential (primary) hypertension: Secondary | ICD-10-CM | POA: Insufficient documentation

## 2018-06-13 DIAGNOSIS — Z79899 Other long term (current) drug therapy: Secondary | ICD-10-CM | POA: Insufficient documentation

## 2018-06-13 DIAGNOSIS — B349 Viral infection, unspecified: Secondary | ICD-10-CM | POA: Insufficient documentation

## 2018-06-13 DIAGNOSIS — Z7901 Long term (current) use of anticoagulants: Secondary | ICD-10-CM | POA: Insufficient documentation

## 2018-06-13 DIAGNOSIS — R059 Cough, unspecified: Secondary | ICD-10-CM

## 2018-06-13 LAB — INFLUENZA PANEL BY PCR (TYPE A & B)
Influenza A By PCR: NEGATIVE
Influenza B By PCR: NEGATIVE

## 2018-06-13 MED ORDER — IBUPROFEN 400 MG PO TABS
600.0000 mg | ORAL_TABLET | Freq: Once | ORAL | Status: AC
  Administered 2018-06-13: 600 mg via ORAL
  Filled 2018-06-13: qty 1

## 2018-06-13 NOTE — ED Provider Notes (Signed)
[facility] EMERGENCY DEPARTMENT Provider Note   CSN: [id number] Arrival date & time: 06/13/18  1924    History   Chief Complaint Chief Complaint  Patient presents with  . Fever    HPI Tina Robinson is a 53 y.o. female who presents with flulike symptoms.  Past medical history significant for obesity, hypertension, paroxysmal A. fib on Xarelto.  Patient states that for the past 2 days she has had fever of 101, chills, body aches, headache, runny nose, and coughing. She has had trouble controlling the fever despite taking antipyretics. She has chest pain with coughing and sometimes shortness of breath when she is lying down from coughing.  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  She does not know anyone specifically with coronavirus.  She has not traveled recently.  She has not had a flu shot this year. She was told she needed to come to the ED to get tested before she could come back to work.     HPI  Past Medical History:  Diagnosis Date  . Atrial flutter, paroxysmal (HCC) 11/04/2016  . Benign essential HTN     Patient Active Problem List   Diagnosis Date Noted  . Obesity (BMI 30-39.9) 08/26/2017  . History of loop recorder 08/02/2017  . Essential hypertension 08/02/2017  . Edema of both feet 08/02/2017  . Shortness of breath 11/12/2016  . Right-sided chest pain 11/12/2016  . Chest pain, rule out acute myocardial infarction   . PAF (paroxysmal atrial fibrillation) (HCC) 11/04/2016  . Leg cramping 11/04/2016    Past Surgical History:  Procedure Laterality Date  . CESAREAN SECTION    . LOOP RECORDER INSERTION N/A 11/26/2016   Procedure: LOOP RECORDER INSERTION;  Surgeon: Thurmon Fair, MD;  Location: [facility] INVASIVE CV LAB;  Service: Cardiovascular;  Laterality: N/A;     OB History   No obstetric history on file.      Home Medications    Prior to Admission medications   Medication Sig Start Date End  Date Taking? Authorizing Provider  acetaminophen (TYLENOL) 500 MG tablet Take 500 mg by mouth every 6 (six) hours as needed for moderate pain.   Yes [provider]  cholecalciferol (VITAMIN D3) 25 MCG (1000 UT) tablet Take 2,000 Units by mouth daily.   Yes [provider]  diltiazem (CARDIZEM CD) 180 MG 24 hr capsule TAKE ONE CAPSULE EVERY DAY Patient taking differently: Take 180 mg by mouth daily.  11/04/17  Yes Croitoru, Mihai, MD  hydrochlorothiazide (MICROZIDE) 12.5 MG capsule Take 1 capsule (12.5 mg total) by mouth daily. 08/02/17 06/13/18 Yes Kilroy, Luke K, PA-C  metoprolol tartrate (LOPRESSOR) 25 MG tablet TAKE 1 TABLET (25 MG TOTAL) BY MOUTH DAILY AS NEEDED. Patient taking differently: Take 25 mg by mouth daily as needed (blood pressure).  06/02/17 06/13/18 Yes Croitoru, Mihai, MD  Multiple Vitamin (MULTIVITAMIN WITH MINERALS) TABS tablet Take 1 tablet by mouth daily.   Yes [provider]  Phenyleph-CPM-DM-APAP (ALKA-SELTZER PLUS COLD & FLU PO) Take 1 capsule by mouth as needed (cold).   Yes [provider]  rivaroxaban (XARELTO) 20 MG TABS tablet Take 1 tablet (20 mg total) by mouth daily with supper. 04/29/17  Yes Croitoru, Mihai, MD    Family History Family History  Problem Relation Age of Onset  . Peripheral Artery Disease Mother        amputee  . Hypertension Mother   . Hypertension  Father   . Stroke Father 32    Social History Social History   Tobacco Use  . Smoking status: Never Smoker  . Smokeless tobacco: Never Used  Substance Use Topics  . Alcohol use: No  . Drug use: No     Allergies   Mushroom extract complex   Review of Systems Review of Systems  Constitutional: Positive for chills, fatigue and fever.  Respiratory: Positive for cough and shortness of breath (with cough).   Cardiovascular: Positive for chest pain (with cough).     Physical Exam Updated Vital Signs BP 131/76 (BP Location: Right Arm)   Pulse 78    Temp 98.2 F (36.8 C) (Oral)   Resp 16   Ht 5\' 7"  (1.702 m)   Wt 99.8 kg   LMP 01/12/2012   SpO2 96%   BMI 34.46 kg/m   Physical Exam Vitals signs and nursing note reviewed.  Constitutional:      General: She is not in acute distress.    Appearance: She is well-developed. She is obese. She is not ill-appearing.  HENT:     Head: Normocephalic and atraumatic.     Right Ear: Tympanic membrane normal.     Left Ear: Tympanic membrane normal.     Nose: Rhinorrhea present.     Mouth/Throat:     Mouth: Mucous membranes are moist.  Eyes:     General: No scleral icterus.       Right eye: No discharge.        Left eye: No discharge.     Conjunctiva/sclera: Conjunctivae normal.     Pupils: Pupils are equal, round, and reactive to light.  Neck:     Musculoskeletal: Normal range of motion.  Cardiovascular:     Rate and Rhythm: Normal rate and regular rhythm.  Pulmonary:     Effort: Pulmonary effort is normal. No respiratory distress.     Breath sounds: Normal breath sounds.  Abdominal:     General: There is no distension.  Skin:    General: Skin is warm and dry.  Neurological:     Mental Status: She is alert and oriented to person, place, and time.  Psychiatric:        Behavior: Behavior normal.      ED Treatments / Results  Labs (all labs ordered are listed, but only abnormal results are displayed) Labs Reviewed  INFLUENZA PANEL BY PCR (TYPE A & B)    EKG None  Radiology Dg Chest 2 View  Result Date: 06/13/2018 CLINICAL DATA:  53 year old female with cough. EXAM: CHEST - 2 VIEW COMPARISON:  Chest radiographs 01/29/2018 and earlier. FINDINGS: Lower lung volumes with chronic cardiomegaly. Stable cardiac size and mediastinal contours. Chronic left chest cardiac event recorder. Visualized tracheal air column is within normal limits. No pleural effusion, consolidation, pulmonary edema or pneumothorax. No acute osseous abnormality identified. Negative visible bowel gas  pattern. IMPRESSION: Lower lung volumes, otherwise no acute cardiopulmonary abnormality. Electronically Signed   By: Odessa Fleming M.D.   On: 06/13/2018 22:16    Procedures Procedures (including critical care time)  Medications Ordered in ED Medications - No data to display   Initial Impression / Assessment and Plan / ED Course  I have reviewed the triage vital signs and the nursing notes.  Pertinent labs & imaging results that were available during my care of the patient were reviewed by me and considered in my medical decision making (see chart for details).  53 year old female presents  with flu like illness for 2 days. Vitals are normal here. Exam is unremarkable other than generalized weakness and fatigue. Flu test and CXR are negative. Discussed with patient. She wants coronavirus testing. She does not meet criteria at this time although she very well may have it. She advised supportive care and to stay home until she has had at least three days without a fever and without taking antipyretics. Return precautions given.  Final Clinical Impressions(s) / ED Diagnoses   Final diagnoses:  Viral syndrome  Cough    ED Discharge Orders    None       Bethel Born, PA-C 06/13/18 [id number]    Raeford Razor, MD 06/14/18 1606

## 2018-06-13 NOTE — Discharge Instructions (Addendum)
Please rest and drink plenty of fluids Alternate Tylenol and Ibuprofen for fever or pain Please stay home from work until you have had at least 3 days of no fever without taking Tylenol or Ibuprofen for it Return if you are worsening

## 2018-06-13 NOTE — ED Triage Notes (Signed)
Pt c/o fever, HA, and cough since yesterday. States did not receive flu shot this year.

## 2018-06-13 NOTE — ED Notes (Signed)
Patient transported to X-ray 

## 2018-06-21 ENCOUNTER — Telehealth: Payer: Self-pay | Admitting: Cardiovascular Disease

## 2018-06-21 NOTE — Telephone Encounter (Signed)
Returned call to pt she went to the ER and was negative for the Flu but still has fever of 103. Did not meet criteria for COVID at that time. Informed pt that she can take Mucinex or robitussin nothing with a D. Pt now states that she has not had a fever for 2 days now. C/o cough, body aches, low grade fever 99.1, runny nose. Informed pt that we do not see pt's for this right now d/t the corona virus. Informed pt that the only thing she ccan do is go to the ER. She states "what do you do if a pt c/o chest pain"? Informed pt that we direct the to go directly to the ER as we cannot treat for chest pain in the office either. She further states that she would like a chest xray and EKG to be reviewed by a doctor in our office. Informed pt that I do not believe that we could do this either d/t current restrictions for the office visits. She states that she works in the building [employer] they have directed her to call us for treatment.  Pt wants to be seen in the office as she does not have a PCP. Informed pt that we cannot see pt with COVID/fever. She is adamant about being seen, as she does not have a PCP. Please advise anything further directions.

## 2018-06-21 NOTE — Telephone Encounter (Signed)
  Patient is c/o being sick over the past week. She has had a temp as high as 103 and coughing. Went to the ED and all tests were negative. She is having some chest discomfort and is wanting to know what can you suggest besides going back to the ED for her chest pain.

## 2018-06-21 NOTE — Telephone Encounter (Signed)
[facility] current recommendation is for e-visit screening for fever, respiratory, and other symptoms concerning for COVID. These visits are available even without a PCP. More information can be found at:  [email or web address]  We are not seeing patients with these symptoms in the office due to risk of transmission. If she has urgent symptoms such as shortness of breath or chest pain, she should present to the ER.

## 2018-06-21 NOTE — Telephone Encounter (Signed)
Pt notified she verbalizes understanding and expresses thanks to me and DOD for our attention to this matter

## 2018-06-22 ENCOUNTER — Encounter: Payer: Self-pay | Admitting: Cardiovascular Disease

## 2018-06-22 ENCOUNTER — Encounter: Payer: Self-pay | Admitting: *Deleted

## 2018-06-22 NOTE — Progress Notes (Signed)
Received triage alert for tachy episode on LINQ--ECG shows NCT at 188 bpm, duration 20sec. History of PAF, +Xarelto, diltiazem, and PRN metoprolol. AT/AF burden 1.2% between 01/31/18-06/22/18, though Cardiac Compass suggests a recent increase in burden. Per notes in Epic, pt was recently seen at the ED for cough, fever, etc.). Routed to Dr. Royann Shivers for review.  Presenting rhythm as of 06/22/18 at 0450 shows AF:   Tachy episode:

## 2018-06-22 NOTE — Progress Notes (Signed)
Probably atypical atrial flutter. Recent increase in arrhythmia burden is not unexpected with an acute respiratory illness. No change in treatment plan for now, unless the episodes become persistent rather than paroxysmal. MCr

## 2018-06-22 NOTE — Telephone Encounter (Signed)
Opened in error

## 2018-06-29 ENCOUNTER — Ambulatory Visit (INDEPENDENT_AMBULATORY_CARE_PROVIDER_SITE_OTHER): Payer: BLUE CROSS/BLUE SHIELD | Admitting: *Deleted

## 2018-06-29 ENCOUNTER — Other Ambulatory Visit: Payer: Self-pay

## 2018-06-29 DIAGNOSIS — I480 Paroxysmal atrial fibrillation: Secondary | ICD-10-CM

## 2018-06-29 LAB — CUP PACEART REMOTE DEVICE CHECK
Date Time Interrogation Session: 20200408113935
Implantable Pulse Generator Implant Date: 20180906

## 2018-07-08 NOTE — Progress Notes (Signed)
Carelink Summary Report / Loop Recorder 

## 2018-07-15 ENCOUNTER — Emergency Department (HOSPITAL_COMMUNITY)
Admission: EM | Admit: 2018-07-15 | Discharge: 2018-07-15 | Disposition: A | Discharge status: Home / Self Care | Payer: BLUE CROSS/BLUE SHIELD | Attending: Emergency Medicine | Admitting: Emergency Medicine

## 2018-07-15 ENCOUNTER — Emergency Department (HOSPITAL_COMMUNITY): Payer: BLUE CROSS/BLUE SHIELD

## 2018-07-15 ENCOUNTER — Other Ambulatory Visit: Payer: Self-pay

## 2018-07-15 ENCOUNTER — Encounter (HOSPITAL_COMMUNITY): Payer: Self-pay | Admitting: Emergency Medicine

## 2018-07-15 DIAGNOSIS — I10 Essential (primary) hypertension: Secondary | ICD-10-CM | POA: Insufficient documentation

## 2018-07-15 DIAGNOSIS — R05 Cough: Secondary | ICD-10-CM | POA: Insufficient documentation

## 2018-07-15 DIAGNOSIS — R079 Chest pain, unspecified: Secondary | ICD-10-CM | POA: Insufficient documentation

## 2018-07-15 DIAGNOSIS — R059 Cough, unspecified: Secondary | ICD-10-CM

## 2018-07-15 DIAGNOSIS — Z20828 Contact with and (suspected) exposure to other viral communicable diseases: Secondary | ICD-10-CM | POA: Insufficient documentation

## 2018-07-15 DIAGNOSIS — Z7901 Long term (current) use of anticoagulants: Secondary | ICD-10-CM | POA: Diagnosis not present

## 2018-07-15 DIAGNOSIS — Z79899 Other long term (current) drug therapy: Secondary | ICD-10-CM | POA: Insufficient documentation

## 2018-07-15 DIAGNOSIS — Z95818 Presence of other cardiac implants and grafts: Secondary | ICD-10-CM | POA: Diagnosis not present

## 2018-07-15 DIAGNOSIS — R0602 Shortness of breath: Secondary | ICD-10-CM | POA: Insufficient documentation

## 2018-07-15 DIAGNOSIS — Z20822 Contact with and (suspected) exposure to covid-19: Secondary | ICD-10-CM

## 2018-07-15 DIAGNOSIS — R509 Fever, unspecified: Secondary | ICD-10-CM | POA: Diagnosis not present

## 2018-07-15 LAB — CBC WITH DIFFERENTIAL/PLATELET
Abs Immature Granulocytes: 0.03 10*3/uL (ref 0.00–0.07)
Basophils Absolute: 0 10*3/uL (ref 0.0–0.1)
Basophils Relative: 0 %
Eosinophils Absolute: 0.2 10*3/uL (ref 0.0–0.5)
Eosinophils Relative: 3 %
HCT: 34.5 % — ABNORMAL LOW (ref 36.0–46.0)
Hemoglobin: 10.8 g/dL — ABNORMAL LOW (ref 12.0–15.0)
Immature Granulocytes: 0 %
Lymphocytes Relative: 30 %
Lymphs Abs: 2.2 10*3/uL (ref 0.7–4.0)
MCH: 28.4 pg (ref 26.0–34.0)
MCHC: 31.3 g/dL (ref 30.0–36.0)
MCV: 90.8 fL (ref 80.0–100.0)
Monocytes Absolute: 1 10*3/uL (ref 0.1–1.0)
Monocytes Relative: 13 %
Neutro Abs: 4 10*3/uL (ref 1.7–7.7)
Neutrophils Relative %: 54 %
Platelets: 246 10*3/uL (ref 150–400)
RBC: 3.8 MIL/uL — ABNORMAL LOW (ref 3.87–5.11)
RDW: 14.2 % (ref 11.5–15.5)
WBC: 7.3 10*3/uL (ref 4.0–10.5)
nRBC: 0 % (ref 0.0–0.2)

## 2018-07-15 LAB — BASIC METABOLIC PANEL
Anion gap: 11 (ref 5–15)
BUN: 10 mg/dL (ref 6–20)
CO2: 25 mmol/L (ref 22–32)
Calcium: 8.9 mg/dL (ref 8.9–10.3)
Chloride: 106 mmol/L (ref 98–111)
Creatinine, Ser: 0.77 mg/dL (ref 0.44–1.00)
GFR calc Af Amer: >60 mL/min (ref >60)
GFR calc non Af Amer: >60 mL/min (ref >60)
Glucose, Bld: 103 mg/dL — ABNORMAL HIGH (ref 70–99)
Potassium: 3.8 mmol/L (ref 3.5–5.1)
Sodium: 142 mmol/L (ref 135–145)

## 2018-07-15 LAB — TROPONIN I: Troponin I: <0.03 ng/mL (ref <0.03)

## 2018-07-15 LAB — D-DIMER, QUANTITATIVE: D-Dimer, Quant: 0.81 ug/mL-FEU — ABNORMAL HIGH (ref 0.00–0.50)

## 2018-07-15 MED ORDER — BENZONATATE 100 MG PO CAPS: 100.0000 mg | ORAL_CAPSULE | Freq: Three times a day (TID) | ORAL | 0 refills | Status: DC

## 2018-07-15 MED ORDER — LORAZEPAM 2 MG/ML IJ SOLN
1.0000 mg | Freq: Once | INTRAMUSCULAR | Status: AC
  Administered 2018-07-15: 15:00:00 1 mg via INTRAVENOUS
  Filled 2018-07-15: qty 1

## 2018-07-15 MED ORDER — IOHEXOL 350 MG/ML SOLN
100.0000 mL | Freq: Once | INTRAVENOUS | Status: AC | PRN
  Administered 2018-07-15: 100 mL via INTRAVENOUS

## 2018-07-15 NOTE — ED Triage Notes (Signed)
Patient reports being here 3 weeks ago having a low grade fever but was not exposed to anyone at that point so was not tested. Patient states since then symptoms have progressed. She adds she was exposed to a positive Covid patient on March 17th. Patient currently c/o cough with chest pain and shortness of breath. Patient denies any fever for the past week. Cough is non productive.

## 2018-07-15 NOTE — ED Notes (Signed)
Patient transported to CT 

## 2018-07-15 NOTE — ED Provider Notes (Signed)
[facility]   CSN: [id number] Arrival date & time: 07/15/18  [id number]    History   Chief Complaint Chief Complaint  Patient presents with  . Shortness of Breath    HPI Ernest HaberCarol Duchesne is a 53 y.o. female with past medical history significant for A flutter, hypertension who presents for evaluation of cough and shortness of breath.  States she has a known coronavirus positive exposure approximately 3 weeks ago.  Patient has had nonproductive cough, intermittent shortness of breath, chest pain and subjective fevers x2 weeks. Was seen here in the emergency department 3 weeks ago with negative flu test. Patient requesting COVID testing at this time.  Chest pain not exertional in nature.  No associated lightheadedness, dizziness, diaphoresis.  She states she thinks she has a history of PE.  Was previously on Xarelto, however has not taken this over the last week.  Pain currently a 2/10.  Pain does not radiate.  Is not taken anything for her symptoms.  States the symptoms have been constant over the last 2 weeks.  Denies additional aggravating or alleviating factors.  Denies fever, chills, nausea, vomiting, headache, neck pain, neck stiffness, abdominal pain, diarrhea, constipation, dysuria, lower extremity edema, erythema, ecchymosis or warmth.  Denies OCPs, no recent surgery or recent trauma.  History obtained from patient.  No interpreter was used.     HPI  Past Medical History:  Diagnosis Date  . Atrial flutter, paroxysmal (HCC) 11/04/2016  . Benign essential HTN     Patient Active Problem List   Diagnosis Date Noted  . Obesity (BMI 30-39.9) 08/26/2017  . History of loop recorder 08/02/2017  . Essential hypertension 08/02/2017  . Edema of both feet 08/02/2017  . Shortness of breath 11/12/2016  . Right-sided chest pain 11/12/2016  . Chest pain, rule out acute myocardial infarction   . PAF (paroxysmal atrial fibrillation) (HCC)  11/04/2016  . Leg cramping 11/04/2016    Past Surgical History:  Procedure Laterality Date  . CESAREAN SECTION    . LOOP RECORDER INSERTION N/A 11/26/2016   Procedure: LOOP RECORDER INSERTION;  Surgeon: Thurmon Fairroitoru, Mihai, MD;  Location: [facility] INVASIVE CV LAB;  Service: Cardiovascular;  Laterality: N/A;     OB History   No obstetric history on file.      Home Medications    Prior to Admission medications   Medication Sig Start Date End Date Taking? Authorizing Provider  acetaminophen (TYLENOL) 500 MG tablet Take 500 mg by mouth every 6 (six) hours as needed for moderate pain.   Yes [provider]  cholecalciferol (VITAMIN D3) 25 MCG (1000 UT) tablet Take 2,000 Units by mouth daily.   Yes [provider]  diltiazem (CARDIZEM CD) 180 MG 24 hr capsule TAKE ONE CAPSULE EVERY DAY Patient taking differently: Take 180 mg by mouth daily.  11/04/17  Yes Croitoru, Mihai, MD  guaiFENesin (MUCINEX) 600 MG 12 hr tablet Take 600 mg by mouth 4 (four) times daily as needed for cough or to loosen phlegm.   Yes [provider]  rivaroxaban (XARELTO) 20 MG TABS tablet Take 1 tablet (20 mg total) by mouth daily with supper. 04/29/17  Yes Croitoru, Mihai, MD  benzonatate (TESSALON) 100 MG capsule Take 1 capsule (100 mg total) by mouth every 8 (eight) hours. 07/15/18   Winnie Barsky A, PA-C  hydrochlorothiazide (MICROZIDE) 12.5 MG capsule Take 1 capsule (12.5 mg total) by mouth daily. 08/02/17 06/13/18  Abelino DerrickKilroy, Luke K, PA-C  metoprolol tartrate (  LOPRESSOR) 25 MG tablet TAKE 1 TABLET (25 MG TOTAL) BY MOUTH DAILY AS NEEDED. Patient taking differently: Take 25 mg by mouth daily as needed (blood pressure).  06/02/17 06/13/18  Croitoru, Rachelle Hora, MD    Family History Family History  Problem Relation Age of Onset  . Peripheral Artery Disease Mother        amputee  . Hypertension Mother   . Hypertension Father   . Stroke Father 4    Social History Social History   Tobacco Use  .  Smoking status: Never Smoker  . Smokeless tobacco: Never Used  Substance Use Topics  . Alcohol use: No  . Drug use: No     Allergies   Mushroom extract complex   Review of Systems Review of Systems  Constitutional: Negative.   HENT: Negative.   Eyes: Negative.   Respiratory: Positive for cough and shortness of breath. Negative for choking, chest tightness, wheezing and stridor.   Cardiovascular: Positive for chest pain. Negative for palpitations and leg swelling.  Gastrointestinal: Negative.   Genitourinary: Negative.   Musculoskeletal: Negative.   Skin: Negative.   All other systems reviewed and are negative.    Physical Exam Updated Vital Signs BP (!) 162/90   Pulse 85   Temp 98.5 F (36.9 C) (Oral)   Resp 18   Ht 5\' 7"  (1.702 m)   Wt 96.2 kg   LMP 01/12/2012   SpO2 100%   BMI 33.20 kg/m   Physical Exam Vitals signs and nursing note reviewed.  Constitutional:      General: She is not in acute distress.    Appearance: She is not ill-appearing, toxic-appearing or diaphoretic.  HENT:     Head: Normocephalic and atraumatic.     Jaw: There is normal jaw occlusion.     Right Ear: Tympanic membrane, ear canal and external ear normal. There is no impacted cerumen. No hemotympanum. Tympanic membrane is not injected, scarred, perforated, erythematous, retracted or bulging.     Left Ear: Tympanic membrane, ear canal and external ear normal. There is no impacted cerumen. No hemotympanum. Tympanic membrane is not injected, scarred, perforated, erythematous, retracted or bulging.     Nose:     Comments: Clear rhinorrhea and congestion to bilateral nares.  No sinus tenderness.    Mouth/Throat:     Comments: Posterior oropharynx clear.  Mucous membranes moist.  Tonsils without erythema or exudate.  Uvula midline without deviation.  No evidence of PTA or RPA.  No drooling, dysphasia or trismus.  Phonation normal. Neck:     Trachea: Trachea and phonation normal.      Meningeal: Brudzinski's sign and Kernig's sign absent.     Comments: No Neck stiffness or neck rigidity.  No meningismus.  No cervical lymphadenopathy. Cardiovascular:     Comments: No murmurs rubs or gallops. Pulmonary:     Comments: Clear to auscultation bilaterally without wheeze, rhonchi or rales.  No accessory muscle usage.  Able speak in full sentences. Abdominal:     Comments: Soft, nontender without rebound or guarding.  No CVA tenderness.  Musculoskeletal:     Comments: Moves all 4 extremities without difficulty.  Lower extremities without edema, erythema or warmth.  Skin:    Comments: Brisk capillary refill.  No rashes or lesions.  Neurological:     Mental Status: She is alert.     Comments: Ambulatory in department without difficulty.  Cranial nerves II through XII grossly intact.  No facial droop.  No aphasia.  ED Treatments / Results  Labs (all labs ordered are listed, but only abnormal results are displayed) Labs Reviewed  CBC WITH DIFFERENTIAL/PLATELET - Abnormal; Notable for the following components:      Result Value   RBC 3.80 (*)    Hemoglobin 10.8 (*)    HCT 34.5 (*)    All other components within normal limits  BASIC METABOLIC PANEL - Abnormal; Notable for the following components:   Glucose, Bld 103 (*)    All other components within normal limits  D-DIMER, QUANTITATIVE (NOT AT [facility]) - Abnormal; Notable for the following components:   D-Dimer, Quant 0.81 (*)    All other components within normal limits  TROPONIN I    EKG None  Radiology Ct Angio Chest Pe W/cm &/or Wo Cm  Result Date: 07/15/2018 CLINICAL DATA:  Cough, chest pain, short of breath. EXAM: CT ANGIOGRAPHY CHEST WITH CONTRAST TECHNIQUE: Multidetector CT imaging of the chest was performed using the standard protocol during bolus administration of intravenous contrast. Multiplanar CT image reconstructions and MIPs were obtained to evaluate the vascular anatomy. CONTRAST:  OMNIPAQUE  IOHEXOL 350 MG/ML SOLN COMPARISON:  None. FINDINGS: Cardiovascular: There are no filling defects in the pulmonary arterial tree to suggest acute pulmonary thromboembolism. Maximal diameter of the ascending aorta is 3.0 cm. There is no evidence of aortic dissection. There is no obvious acute intramural hematoma. Great vessels are patent within the confines of the study. Vertebral arteries are also patent within the confines of the examination. Direct takeoff of the left vertebral artery from the arch. Mediastinum/Nodes: No abnormal mediastinal adenopathy. No pericardial effusion. Esophagus is unremarkable. Thyroid is within normal limits. Lungs/Pleura: Lungs are under aerated with patchy hypoaeration but no definitive consolidation. No pneumothorax or pleural effusion. Upper Abdomen: No acute abnormality. Musculoskeletal: There is no vertebral compression deformity. Review of the MIP images confirms the above findings. IMPRESSION: No evidence of acute pulmonary thromboembolism. Electronically Signed   By: Jolaine Click M.D.   On: 07/15/2018 15:37   Dg Chest Portable 1 View  Result Date: 07/15/2018 CLINICAL DATA:  Cough and shortness of breath. EXAM: PORTABLE CHEST 1 VIEW COMPARISON:  Chest x-ray dated June 13, 2018. FINDINGS: Stable cardiomediastinal silhouette and loop recorder. Normal pulmonary vascularity. No focal consolidation, pleural effusion, or pneumothorax. No acute osseous abnormality. IMPRESSION: No active disease. Electronically Signed   By: Obie Dredge M.D.   On: 07/15/2018 12:10    Procedures Procedures (including critical care time)  Medications Ordered in ED Medications  LORazepam (ATIVAN) injection 1 mg (1 mg Intravenous Given 07/15/18 1507)  iohexol (OMNIPAQUE) 350 MG/ML injection 100 mL (100 mLs Intravenous Contrast Given 07/15/18 1511)   Initial Impression / Assessment and Plan / ED Course  I have reviewed the triage vital signs and the nursing notes.  Pertinent labs & imaging  results that were available during my care of the patient were reviewed by me and considered in my medical decision making (see chart for details).  53 year old female appears otherwise well presents for evaluation of cough, shortness of breath and intermittent fever.  Afebrile, nonseptic, non-ill-appearing.  Has had the symptoms x2 weeks.  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  Chest pain not exertional in nature.  No hemoptysis, lower extremity edema, erythema, ecchymosis or warmth.  Was previously on Xarelto for A. Fib.  Possible PE.  Has not taken this x1 week.  Heart score 2-age, risk factor.  Not tachycardic, tachypneic or hypoxic in  ED.  She is able to ambulate with oxygen saturations greater than 95%.  She tolerating p.o.'s.  Her lungs sound clear.  Heart without murmurs, rubs or gallops.  Abdomen soft.  No lower extremity edema, erythema, ecchymosis or warmth.  She is nontender bilateral calves.  Will obtain labs, EKG, imaging and reevaluate.  CBC with hemoglobin 10.8, at baseline previous 10.6.  Metabolic panel without electrolyte, renal or liver abnormality, elevated d-dimer, troponin negative, chest x-ray without evidence of infiltrates, cardiomegaly or pneumothorax.,  EKG without ischemic changes.  No STEMI.  Elevated d-dimer CT PE obtained.  No evidence of pulmonary embolism.  Patient ambulatory in department without difficulty.  She has remained without tachycardia tachypnea or hypoxia.  Unfortunately, patient does not meet CDC or [place] criteria to be tested for coronavirus at this time.  Discussed this with patient.  Patient to be given symptomatic management.  Patient to follow-up with PCP for reevaluation.  I have low suspicion for ACS, PE, dissection, pneumothorax, pericarditis, reflux, Boerhaave syndrome causing patient's symptoms.  Patient is to be discharged with recommendation to follow up with PCP in regards to today's hospital visit. Chest  pain is not likely of cardiac or pulmonary etiology d/t presentation, PERC negative, VSS, no tracheal deviation, no JVD or new murmur, RRR, breath sounds equal bilaterally, EKG without acute abnormalities, negative troponin, and negative CXR. Pt has been advised to return to the ED if CP becomes exertional, associated with diaphoresis or nausea, radiates to left jaw/arm, worsens or becomes concerning in any way. Pt appears reliable for follow up and is agreeable to discharge.       Final Clinical Impressions(s) / ED Diagnoses   Final diagnoses:  Exposure to Covid-19 Virus  Cough  SOB (shortness of breath)    ED Discharge Orders         Ordered    benzonatate (TESSALON) 100 MG capsule  Every 8 hours     07/15/18 1608           Martyn Timme A, PA-C 07/15/18 1641    Benjiman Core, MD 07/18/18 1536

## 2018-07-15 NOTE — ED Notes (Signed)
Pt reports she is claustrophobic and unable to lie still for CT scan. PA made aware by this RN.

## 2018-07-15 NOTE — Discharge Instructions (Signed)
Evaluated for cough and SOB. You did not meet criteria to be tested for COVID at this time. Follow up with PCP.  Return to the ED with any new or worsening symptoms.

## 2018-08-01 ENCOUNTER — Other Ambulatory Visit: Payer: Self-pay

## 2018-08-01 ENCOUNTER — Ambulatory Visit (INDEPENDENT_AMBULATORY_CARE_PROVIDER_SITE_OTHER): Payer: BLUE CROSS/BLUE SHIELD | Admitting: *Deleted

## 2018-08-01 DIAGNOSIS — I480 Paroxysmal atrial fibrillation: Secondary | ICD-10-CM

## 2018-08-01 LAB — CUP PACEART REMOTE DEVICE CHECK
Date Time Interrogation Session: 20200511144112
Implantable Pulse Generator Implant Date: 20180906

## 2018-08-11 NOTE — Progress Notes (Signed)
Carelink Summary Report / Loop Recorder 

## 2018-09-05 ENCOUNTER — Ambulatory Visit (INDEPENDENT_AMBULATORY_CARE_PROVIDER_SITE_OTHER): Payer: BC Managed Care – PPO | Admitting: *Deleted

## 2018-09-05 DIAGNOSIS — I480 Paroxysmal atrial fibrillation: Secondary | ICD-10-CM | POA: Diagnosis not present

## 2018-09-05 LAB — CUP PACEART REMOTE DEVICE CHECK
Date Time Interrogation Session: 20200613153614
Implantable Pulse Generator Implant Date: 20180906

## 2018-09-13 NOTE — Progress Notes (Signed)
Carelink Summary Report / Loop Recorder 

## 2018-10-04 ENCOUNTER — Telehealth: Payer: Self-pay | Admitting: Cardiovascular Disease

## 2018-10-04 NOTE — Telephone Encounter (Signed)
LVM, reminding pt of her appt with Dr C on 10-05-18. I also asked pt s she wants an Office Visit or Virtual Visit.

## 2018-10-05 ENCOUNTER — Ambulatory Visit (INDEPENDENT_AMBULATORY_CARE_PROVIDER_SITE_OTHER): Payer: BC Managed Care – PPO | Admitting: Cardiovascular Disease

## 2018-10-05 ENCOUNTER — Other Ambulatory Visit: Payer: Self-pay

## 2018-10-05 ENCOUNTER — Encounter: Payer: Self-pay | Admitting: Cardiovascular Disease

## 2018-10-05 VITALS — BP 140/94 | HR 82 | Temp 97.7°F | Ht 66.0 in | Wt 237.6 lb

## 2018-10-05 DIAGNOSIS — Z6835 Body mass index (BMI) 35.0-35.9, adult: Secondary | ICD-10-CM

## 2018-10-05 DIAGNOSIS — I10 Essential (primary) hypertension: Secondary | ICD-10-CM | POA: Diagnosis not present

## 2018-10-05 DIAGNOSIS — Z9889 Other specified postprocedural states: Secondary | ICD-10-CM

## 2018-10-05 DIAGNOSIS — Z7901 Long term (current) use of anticoagulants: Secondary | ICD-10-CM | POA: Diagnosis not present

## 2018-10-05 DIAGNOSIS — I480 Paroxysmal atrial fibrillation: Secondary | ICD-10-CM

## 2018-10-05 DIAGNOSIS — E559 Vitamin D deficiency, unspecified: Secondary | ICD-10-CM

## 2018-10-05 MED ORDER — NEBIVOLOL HCL 2.5 MG PO TABS: 2.5000 mg | ORAL_TABLET | Freq: Every day | ORAL | 5 refills | Status: DC

## 2018-10-05 NOTE — Patient Instructions (Addendum)
Medication Instructions:  START Bystolic 2.5 mg once daily  -samples given. Please take half the tablet of the 5 mg sample If you need a refill on your cardiac medications before your next appointment, please call your pharmacy.   Lab work: Your provider would like for you to have the following labs today: CBC and TSH  If you have labs (blood work) drawn today and your tests are completely normal, you will receive your results only by: Marland Kitchen MyChart Message (if you have MyChart) OR . A paper copy in the mail If you have any lab test that is abnormal or we need to change your treatment, we will call you to review the results.  Testing/Procedures: None ordered  Follow-Up: At [facility], you and your health needs are our priority.  As part of our continuing mission to provide you with exceptional heart care, we have created designated Provider Care Teams.  These Care Teams include your primary Cardiologist (physician) and Advanced Practice Providers (APPs -  Physician Assistants and Nurse Practitioners) who all work together to provide you with the care you need, when you need it. You will need a follow up appointment in 6 months.  Please call our office 2 months in advance to schedule this appointment.  You may see Sanda Klein, MD or one of the following Advanced Practice Providers on your designated Care Team: [place], [place] . Fabian Sharp, PA-C  Any Other Special Instructions Will Be Listed Below (If Applicable). Please check your blood pressure daily and keep a log of this. Call the office or send a MyChart message in 2 weeks with the recordings. [phone]

## 2018-10-05 NOTE — Progress Notes (Signed)
Cardiology Office Note:    Date:  10/07/2018   ID:  Tina Robinson, DOB 1966/03/19, MRN [id number]  PCP:  Patient, No Pcp Per  Cardiologist:  Sanda Klein, MD    Referring MD: No ref. provider found   No chief complaint on file. Follow-up atrial fibrillation  History of Present Illness:    Tina Robinson is a 53 y.o. female with a hx of paroxysmal atypical atrial flutter and atrial fibrillation, status post implantable loop recorder, returning for follow-up.  Tina Robinson is recovering from COVID-19 infection.  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  She had unrelenting fever for a couple of weeks, severe malaise and myalgias, but never had problems with respiration.  She still feels very weak.  Although she has returned to work she can only work 2 days a week.  She complains of severe fatigue, headaches and reports that her hair is falling out in clumps.  During her acute illness there was a slight increase in the burden of atrial arrhythmia, but this has returned to baseline by loop recorder monitoring.  The overall burden of atrial fibrillation is only 0.8%.Most episodes only last for a few minutes, rarely over 10 minutes.  Rate control is mediocre with ventricular rates in the 120-140 range at times.  She denies problems with bleeding or easy bruising and is compliant with the oral anticoagulant.  She denies angina at rest or with activity, orthopnea, PND, leg edema, claudication, focal neurological events, falls, injuries or serious bleeding.  Past Medical History:  Diagnosis Date   Atrial flutter, paroxysmal ([place]) 11/04/2016   Benign essential HTN     Past Surgical History:  Procedure Laterality Date   CESAREAN SECTION     LOOP RECORDER INSERTION N/A 11/26/2016   Procedure: LOOP RECORDER INSERTION;  Surgeon: Sanda Klein, MD;  Location: [facility] CV LAB;  Service: Cardiovascular;  Laterality: N/A;    Current Medications: Current Meds  Medication Sig    diltiazem (CARDIZEM CD) 180 MG 24 hr capsule TAKE ONE CAPSULE EVERY DAY (Patient taking differently: Take 180 mg by mouth daily. )   hydrochlorothiazide (MICROZIDE) 12.5 MG capsule Take 1 capsule (12.5 mg total) by mouth daily.   metoprolol tartrate (LOPRESSOR) 25 MG tablet TAKE 1 TABLET (25 MG TOTAL) BY MOUTH DAILY AS NEEDED. (Patient taking differently: Take 25 mg by mouth daily as needed (blood pressure). )   rivaroxaban (XARELTO) 20 MG TABS tablet Take 1 tablet (20 mg total) by mouth daily with supper.   [DISCONTINUED] cholecalciferol (VITAMIN D3) 25 MCG (1000 UT) tablet Take 2,000 Units by mouth daily.     Allergies:   Mushroom extract complex   Social History   Socioeconomic History   Marital status: Single    Spouse name: Not on file   Number of children: Not on file   Years of education: Not on file   Highest education level: Not on file  Occupational History   Not on file  Social Needs   Financial resource strain: Not on file   Food insecurity    Worry: Not on file    Inability: Not on file   Transportation needs    Medical: Not on file    Non-medical: Not on file  Tobacco Use   Smoking status: Never Smoker   Smokeless tobacco: Never Used  Substance and Sexual Activity   Alcohol use: No   Drug use: No   Sexual activity: Not on file  Lifestyle   Physical  activity    Days per week: Not on file    Minutes per session: Not on file   Stress: Not on file  Relationships   Social connections    Talks on phone: Not on file    Gets together: Not on file    Attends religious service: Not on file    Active member of club or organization: Not on file    Attends meetings of clubs or organizations: Not on file    Relationship status: Not on file  Other Topics Concern   Not on file  Social History Narrative   Lives with and helps care for 2 grandchildren and her son     Family History: The patient's family history includes Hypertension in her  father and mother; Peripheral Artery Disease in her mother; Stroke (age of onset: 2985) in her father.  ROS:   Please see the history of present illness.     All other systems reviewed and are negative.  EKGs/Labs/Other Studies Reviewed:    The following studies were reviewed today: Loop recorder download  EKG:  EKG is not ordered today.  Loop recorder download shows sinus rhythm with frequent PACs  Recent Labs: 12/07/2017: ALT 17 07/15/2018: BUN 10; Creatinine, Ser 0.77; Potassium 3.8; Sodium 142 10/06/2018: Hemoglobin 11.9; Platelets 305; TSH 1.390  Recent Lipid Panel    Component Value Date/Time   CHOL 180 12/07/2017 1134   TRIG 99 12/07/2017 1134   HDL 61 12/07/2017 1134   CHOLHDL 3.0 12/07/2017 1134   CHOLHDL 2.8 11/05/2016 0322   VLDL 13 11/05/2016 0322   LDLCALC 99 12/07/2017 1134    Physical Exam:    VS:  BP (!) 140/94    Pulse 82    Temp 97.7 F (36.5 C)    Ht 5\' 6"  (1.676 m)    Wt 237 lb 9.6 oz (107.8 kg)    LMP 01/12/2012    SpO2 100%    BMI 38.35 kg/m     Wt Readings from Last 3 Encounters:  10/05/18 237 lb 9.6 oz (107.8 kg)  07/15/18 212 lb (96.2 kg)  06/13/18 220 lb (99.8 kg)      General: Alert, oriented x3, no distress, moderately obese.  She does not appear pale. Head: no evidence of trauma, PERRL, EOMI, no exophtalmos or lid lag, no myxedema, no xanthelasma; normal ears, nose and oropharynx Neck: normal jugular venous pulsations and no hepatojugular reflux; brisk carotid pulses without delay and no carotid bruits Chest: clear to auscultation, no signs of consolidation by percussion or palpation, normal fremitus, symmetrical and full respiratory excursions Cardiovascular: normal position and quality of the apical impulse, regular rhythm, normal first and second heart sounds, no murmurs, rubs or gallops Abdomen: no tenderness or distention, no masses by palpation, no abnormal pulsatility or arterial bruits, normal bowel sounds, no  hepatosplenomegaly Extremities: no clubbing, cyanosis or edema; 2+ radial, ulnar and brachial pulses bilaterally; 2+ right femoral, posterior tibial and dorsalis pedis pulses; 2+ left femoral, posterior tibial and dorsalis pedis pulses; no subclavian or femoral bruits Neurological: grossly nonfocal Psych: Normal mood and affect.  She appears slightly anxious.   ASSESSMENT:    1. PAF (paroxysmal atrial fibrillation) (HCC)   2. Long term current use of anticoagulant    PLAN:    In order of problems listed above:  1. Paroxysmal atrial flutter and fibrillation: The episodes are not clearly symptomatic.  She is appropriately anticoagulated.  Chadsvasc 2 (gender, hypertension).  Rate control is mediocre, but  the episodes are infrequent. 2. Anticoagulation: tolerating anticoagulation without serious bleeding complications.   3. HTN: She reports that her blood pressure is always elevated.  We will add Bystolic to diltiazem. 4. Severe obesity with comorbid conditions: Discussed the importance of weight loss to reduce the burden of arrhythmia.  5. Hair loss, fatigue: We will check for thyroid disease and anemia.  We will also check a vitamin D level since she has a history of low vitamin D levels.  Most likely has a protracted recovery from a severe viral infection.  She only takes metoprolol "as needed" it is unlikely that this is causing her hair loss.    Medication Adjustments/Labs and Tests Ordered: Current medicines are reviewed at length with the patient today.  Concerns regarding medicines are outlined above.  Orders Placed This Encounter  Procedures   CBC   TSH   VITAMIN D 25 Hydroxy (Vit-D Deficiency, Fractures)   Meds ordered this encounter  Medications   nebivolol (BYSTOLIC) 2.5 MG tablet    Sig: Take 1 tablet (2.5 mg total) by mouth daily.    Dispense:  30 tablet    Refill:  5    Signed, Thurmon FairMihai Anastaisa Wooding, MD  10/07/2018 4:42 PM    [facility]

## 2018-10-06 ENCOUNTER — Other Ambulatory Visit: Payer: Self-pay

## 2018-10-06 ENCOUNTER — Ambulatory Visit (INDEPENDENT_AMBULATORY_CARE_PROVIDER_SITE_OTHER): Payer: BC Managed Care – PPO | Admitting: *Deleted

## 2018-10-06 DIAGNOSIS — I480 Paroxysmal atrial fibrillation: Secondary | ICD-10-CM | POA: Diagnosis not present

## 2018-10-06 DIAGNOSIS — Z7901 Long term (current) use of anticoagulants: Secondary | ICD-10-CM | POA: Diagnosis not present

## 2018-10-06 NOTE — Progress Notes (Signed)
Pt told [facility] she does not have PCP she states that she needs it done

## 2018-10-07 ENCOUNTER — Telehealth: Payer: Self-pay | Admitting: *Deleted

## 2018-10-07 DIAGNOSIS — Z6835 Body mass index (BMI) 35.0-35.9, adult: Secondary | ICD-10-CM | POA: Insufficient documentation

## 2018-10-07 LAB — CUP PACEART REMOTE DEVICE CHECK
Date Time Interrogation Session: 20200716181146
Implantable Pulse Generator Implant Date: 20180906

## 2018-10-07 LAB — CBC
Hematocrit: 37.1 % (ref 34.0–46.6)
Hemoglobin: 11.9 g/dL (ref 11.1–15.9)
MCH: 28.5 pg (ref 26.6–33.0)
MCHC: 32.1 g/dL (ref 31.5–35.7)
MCV: 89 fL (ref 79–97)
Platelets: 305 10*3/uL (ref 150–450)
RBC: 4.17 x10E6/uL (ref 3.77–5.28)
RDW: 12.9 % (ref 11.7–15.4)
WBC: 6.5 10*3/uL (ref 3.4–10.8)

## 2018-10-07 LAB — VITAMIN D 25 HYDROXY (VIT D DEFICIENCY, FRACTURES): Vit D, 25-Hydroxy: 22.1 ng/mL — ABNORMAL LOW (ref 30.0–100.0)

## 2018-10-07 LAB — TSH: TSH: 1.39 u[IU]/mL (ref 0.450–4.500)

## 2018-10-07 MED ORDER — CHOLECALCIFEROL 20 MCG (800 UNIT) PO TABS: 800.0000 [IU] | ORAL_TABLET | Freq: Every day | ORAL | 3 refills | Status: DC

## 2018-10-07 NOTE — Telephone Encounter (Signed)
Patient made aware of results and verbalized understanding.  She stated that she does not have a PCP and would like to know if Dr. Sallyanne Kuster has any recommendations for the low Vitamin D.   Notes recorded by Sanda Klein, MD on 10/07/2018 at 10:10 AM EDT  Vitamin D levels are low, please forward to PCP

## 2018-10-07 NOTE — Telephone Encounter (Signed)
Cholecalciferol 800 Units has been sent in for the patient. She verbalized her understanding.

## 2018-10-07 NOTE — Telephone Encounter (Signed)
-----   Message from Sanda Klein, MD sent at 10/06/2018  7:11 PM EDT ----- Normal CBC

## 2018-10-07 NOTE — Telephone Encounter (Signed)
Let's Rx cholecalciferol 800 units daily, #90, RF3. Needs a PCP! MCr

## 2018-10-09 ENCOUNTER — Emergency Department (HOSPITAL_COMMUNITY): Payer: BC Managed Care – PPO

## 2018-10-09 ENCOUNTER — Encounter (HOSPITAL_COMMUNITY): Payer: Self-pay | Admitting: Emergency Medicine

## 2018-10-09 ENCOUNTER — Other Ambulatory Visit: Payer: Self-pay

## 2018-10-09 ENCOUNTER — Emergency Department (HOSPITAL_COMMUNITY)
Admission: EM | Admit: 2018-10-09 | Discharge: 2018-10-10 | Disposition: A | Discharge status: Home / Self Care | Payer: BC Managed Care – PPO | Attending: Emergency Medicine | Admitting: Emergency Medicine

## 2018-10-09 DIAGNOSIS — Z7901 Long term (current) use of anticoagulants: Secondary | ICD-10-CM

## 2018-10-09 DIAGNOSIS — Z79899 Other long term (current) drug therapy: Secondary | ICD-10-CM | POA: Insufficient documentation

## 2018-10-09 DIAGNOSIS — H81399 Other peripheral vertigo, unspecified ear: Secondary | ICD-10-CM

## 2018-10-09 DIAGNOSIS — R519 Headache, unspecified: Secondary | ICD-10-CM

## 2018-10-09 DIAGNOSIS — R51 Headache: Secondary | ICD-10-CM | POA: Diagnosis not present

## 2018-10-09 DIAGNOSIS — I480 Paroxysmal atrial fibrillation: Secondary | ICD-10-CM | POA: Diagnosis not present

## 2018-10-09 DIAGNOSIS — I10 Essential (primary) hypertension: Secondary | ICD-10-CM | POA: Insufficient documentation

## 2018-10-09 DIAGNOSIS — R079 Chest pain, unspecified: Secondary | ICD-10-CM | POA: Diagnosis not present

## 2018-10-09 HISTORY — DX: Cardiac murmur, unspecified: R01.1

## 2018-10-09 LAB — BASIC METABOLIC PANEL
Anion gap: 9 (ref 5–15)
BUN: 14 mg/dL (ref 6–20)
CO2: 29 mmol/L (ref 22–32)
Calcium: 9 mg/dL (ref 8.9–10.3)
Chloride: 100 mmol/L (ref 98–111)
Creatinine, Ser: 0.81 mg/dL (ref 0.44–1.00)
GFR calc Af Amer: >60 mL/min (ref >60)
GFR calc non Af Amer: >60 mL/min (ref >60)
Glucose, Bld: 116 mg/dL — ABNORMAL HIGH (ref 70–99)
Potassium: 4.1 mmol/L (ref 3.5–5.1)
Sodium: 138 mmol/L (ref 135–145)

## 2018-10-09 LAB — CBC
HCT: 37.3 % (ref 36.0–46.0)
Hemoglobin: 12 g/dL (ref 12.0–15.0)
MCH: 28.7 pg (ref 26.0–34.0)
MCHC: 32.2 g/dL (ref 30.0–36.0)
MCV: 89.2 fL (ref 80.0–100.0)
Platelets: 316 10*3/uL (ref 150–400)
RBC: 4.18 MIL/uL (ref 3.87–5.11)
RDW: 12.7 % (ref 11.5–15.5)
WBC: 9.4 10*3/uL (ref 4.0–10.5)
nRBC: 0 % (ref 0.0–0.2)

## 2018-10-09 LAB — I-STAT BETA HCG BLOOD, ED (MC, WL, AP ONLY): I-stat hCG, quantitative: <5 m[IU]/mL (ref <5)

## 2018-10-09 LAB — TROPONIN I (HIGH SENSITIVITY): Troponin I (High Sensitivity): 4 ng/L (ref <18)

## 2018-10-09 MED ORDER — SODIUM CHLORIDE 0.9% FLUSH: 3.0000 mL | Freq: Once | INTRAVENOUS | Status: DC

## 2018-10-09 NOTE — ED Triage Notes (Signed)
Pt presents with HA x 3 days, vertigo today, intermittent sharp chest pain around loop recorder for several days. +nausea, development of murmur in last few months after presumed COVID infection in late march.  Pt did have a change in medication Friday from Cardizem 397 to Bystolic.

## 2018-10-10 ENCOUNTER — Emergency Department (HOSPITAL_COMMUNITY): Payer: BC Managed Care – PPO

## 2018-10-10 DIAGNOSIS — R51 Headache: Secondary | ICD-10-CM | POA: Diagnosis not present

## 2018-10-10 LAB — BASIC METABOLIC PANEL
Anion gap: 10 (ref 5–15)
BUN: 14 mg/dL (ref 6–20)
CO2: 25 mmol/L (ref 22–32)
Calcium: 9 mg/dL (ref 8.9–10.3)
Chloride: 104 mmol/L (ref 98–111)
Creatinine, Ser: 0.85 mg/dL (ref 0.44–1.00)
GFR calc Af Amer: >60 mL/min (ref >60)
GFR calc non Af Amer: >60 mL/min (ref >60)
Glucose, Bld: 106 mg/dL — ABNORMAL HIGH (ref 70–99)
Potassium: 3.7 mmol/L (ref 3.5–5.1)
Sodium: 139 mmol/L (ref 135–145)

## 2018-10-10 LAB — TROPONIN I (HIGH SENSITIVITY): Troponin I (High Sensitivity): 2 ng/L (ref <18)

## 2018-10-10 MED ORDER — MECLIZINE HCL 25 MG PO TABS
25.0000 mg | ORAL_TABLET | Freq: Once | ORAL | Status: AC
  Administered 2018-10-10: 25 mg via ORAL
  Filled 2018-10-10: qty 1

## 2018-10-10 MED ORDER — MECLIZINE HCL 25 MG PO TABS: 25.0000 mg | ORAL_TABLET | Freq: Three times a day (TID) | ORAL | 0 refills | Status: AC | PRN

## 2018-10-10 MED ORDER — SODIUM CHLORIDE 0.9 % IV BOLUS
1000.0000 mL | Freq: Once | INTRAVENOUS | Status: AC
  Administered 2018-10-10: 1000 mL via INTRAVENOUS

## 2018-10-10 MED ORDER — DEXAMETHASONE SODIUM PHOSPHATE 10 MG/ML IJ SOLN
10.0000 mg | Freq: Once | INTRAMUSCULAR | Status: AC
  Administered 2018-10-10: 10 mg via INTRAVENOUS
  Filled 2018-10-10: qty 1

## 2018-10-10 MED ORDER — DIPHENHYDRAMINE HCL 50 MG/ML IJ SOLN
25.0000 mg | Freq: Once | INTRAMUSCULAR | Status: AC
  Administered 2018-10-10: 25 mg via INTRAVENOUS
  Filled 2018-10-10: qty 1

## 2018-10-10 MED ORDER — LORAZEPAM 2 MG/ML IJ SOLN
1.0000 mg | Freq: Once | INTRAMUSCULAR | Status: AC
  Administered 2018-10-10: 1 mg via INTRAVENOUS
  Filled 2018-10-10: qty 1

## 2018-10-10 MED ORDER — METOCLOPRAMIDE HCL 5 MG/ML IJ SOLN
10.0000 mg | Freq: Once | INTRAMUSCULAR | Status: AC
  Administered 2018-10-10: 01:00:00 10 mg via INTRAVENOUS
  Filled 2018-10-10: qty 2

## 2018-10-10 NOTE — ED Notes (Signed)
Nurse starting IV and will draw labs. 

## 2018-10-10 NOTE — Discharge Instructions (Signed)
Return if symptoms are getting worse. °

## 2018-10-10 NOTE — ED Provider Notes (Signed)
[facility] Provider Note   CSN: [id number] Arrival date & time: 10/09/18  2042    History   Chief Complaint Chief Complaint  Patient presents with  . Headache    HPI Tina HaberCarol Robinson is a 53 y.o. female.   The history is provided by the patient.  She has history of hypertension, paroxysmal atrial fibrillation anticoagulated on rivaroxaban and comes in because of elevated blood pressure and headaches.  She saw her cardiologist last week and medications were changed-diltiazem was discontinued and she was started on nebivolol.  3 days ago, she started getting bifrontal headaches.  Headaches are dull and throbbing in as severe as 10/10.  Currently, headache is actually declined and she only rates it at 4/10.  Headache is worse with exposure to bright light.  There has been some slight blurry vision.  There has been photophobia but no phonophobia.  She denies fever or chills.  Today, she noted vertigo consisting of room spinning and nausea if she bent over or stood up.  She has been taking her blood pressure during this time and it has been higher than normal with most readings around 160/100.  Usual blood pressure at home is about 135/85.  She has not taken anything for her headache or dizziness.  She denies any ear pain or decreased hearing.  She denies exposure to anyone with COVID-19.  Past Medical History:  Diagnosis Date  . Atrial flutter, paroxysmal (HCC) 11/04/2016  . Benign essential HTN   . Murmur, cardiac     Patient Active Problem List   Diagnosis Date Noted  . Severe obesity (BMI 35.0-35.9 with comorbidity) (HCC) 10/07/2018  . Obesity (BMI 30-39.9) 08/26/2017  . History of loop recorder 08/02/2017  . Essential hypertension 08/02/2017  . Edema of both feet 08/02/2017  . Shortness of breath 11/12/2016  . Right-sided chest pain 11/12/2016  . Chest pain, rule out acute myocardial infarction   . PAF (paroxysmal atrial fibrillation) (HCC)  11/04/2016  . Leg cramping 11/04/2016    Past Surgical History:  Procedure Laterality Date  . CESAREAN SECTION    . LOOP RECORDER INSERTION N/A 11/26/2016   Procedure: LOOP RECORDER INSERTION;  Surgeon: Thurmon Fairroitoru, Mihai, MD;  Location: [facility] INVASIVE CV LAB;  Service: Cardiovascular;  Laterality: N/A;     OB History   No obstetric history on file.      Home Medications    Prior to Admission medications   Medication Sig Start Date End Date Taking? Authorizing Provider  acetaminophen (TYLENOL) 500 MG tablet Take 500 mg by mouth every 6 (six) hours as needed for moderate pain.    [provider]  benzonatate (TESSALON) 100 MG capsule Take 1 capsule (100 mg total) by mouth every 8 (eight) hours. Patient not taking: Reported on 10/05/2018 07/15/18   Henderly, Britni A, PA-C  Cholecalciferol 20 MCG (800 UNIT) TABS Take 800 Units by mouth daily. 10/07/18   Croitoru, Mihai, MD  diltiazem (CARDIZEM CD) 180 MG 24 hr capsule TAKE ONE CAPSULE EVERY DAY Patient taking differently: Take 180 mg by mouth daily.  11/04/17   Croitoru, Mihai, MD  guaiFENesin (MUCINEX) 600 MG 12 hr tablet Take 600 mg by mouth 4 (four) times daily as needed for cough or to loosen phlegm.    [provider]  hydrochlorothiazide (MICROZIDE) 12.5 MG capsule Take 1 capsule (12.5 mg total) by mouth daily. 08/02/17 10/05/18  Abelino DerrickKilroy, Luke K, PA-C  metoprolol tartrate (LOPRESSOR) 25 MG tablet TAKE 1 TABLET (  25 MG TOTAL) BY MOUTH DAILY AS NEEDED. Patient taking differently: Take 25 mg by mouth daily as needed (blood pressure).  06/02/17 10/05/18  Croitoru, Mihai, MD  nebivolol (BYSTOLIC) 2.5 MG tablet Take 1 tablet (2.5 mg total) by mouth daily. 10/05/18   Croitoru, Mihai, MD  rivaroxaban (XARELTO) 20 MG TABS tablet Take 1 tablet (20 mg total) by mouth daily with supper. 04/29/17   Croitoru, Rachelle HoraMihai, MD    Family History Family History  Problem Relation Age of Onset  . Peripheral Artery Disease Mother        amputee  .  Hypertension Mother   . Hypertension Father   . Stroke Father [id number]    Social History Social History   Tobacco Use  . Smoking status: Never Smoker  . Smokeless tobacco: Never Used  Substance Use Topics  . Alcohol use: No  . Drug use: No     Allergies   Mushroom extract complex   Review of Systems Review of Systems  All other systems reviewed and are negative.    Physical Exam Updated Vital Signs BP (!) 154/80 (BP Location: Right Arm)   Pulse 61   Temp 98.5 F (36.9 C) (Oral)   Resp 16   Ht 5\' 7"  (1.702 m)   Wt 107 kg   LMP 01/12/2012   SpO2 99%   BMI 36.95 kg/m   Physical Exam Vitals signs and nursing note reviewed.    53 year old female, resting comfortably and in no acute distress. Vital signs are significant for elevated blood pressure. Oxygen saturation is 99%, which is normal. Head is normocephalic and atraumatic. PERRLA, EOMI. Oropharynx is clear. Neck is nontender and supple without adenopathy or JVD. Back is nontender and there is no CVA tenderness. Lungs are clear without rales, wheezes, or rhonchi. Chest is nontender. Heart has regular rate and rhythm without murmur. Abdomen is soft, flat, nontender without masses or hepatosplenomegaly and peristalsis is normoactive. Extremities have trace edema, full range of motion is present. Skin is warm and dry without rash. Neurologic: Mental status is normal, cranial nerves are intact, there are no motor or sensory deficits.  There is no nystagmus.  Dizziness is reproduced by passive head movement.  ED Treatments / Results  Labs (all labs ordered are listed, but only abnormal results are displayed) Labs Reviewed  BASIC METABOLIC PANEL - Abnormal; Notable for the following components:      Result Value   Glucose, Bld 116 (*)    All other components within normal limits  BASIC METABOLIC PANEL - Abnormal; Notable for the following components:   Glucose, Bld 106 (*)    All other components within normal  limits  CBC  I-STAT BETA HCG BLOOD, ED ([facility], [facility], [facility] ONLY)  TROPONIN I (HIGH SENSITIVITY)  TROPONIN I (HIGH SENSITIVITY)    EKG EKG Interpretation  Date/Time:  Sunday October 09 2018 21:00:43 EDT Ventricular Rate:  69 PR Interval:  138 QRS Duration: 110 QT Interval:  422 QTC Calculation: 452 R Axis:   10 Text Interpretation:  Normal sinus rhythm Normal ECG When compared with ECG of 07/15/2018, Nonspecific T wave abnormality is no longer present Confirmed by Dione BoozeGlick, Rudra Hobbins ([id number]) on 10/09/2018 11:26:10 PM   Radiology Dg Chest 2 View  Result Date: 10/09/2018 CLINICAL DATA:  Chest pain with dizziness. EXAM: CHEST - 2 VIEW COMPARISON:  Chest radiograph and CTA 07/15/2018 FINDINGS: A loop recorder remains in place. The cardiomediastinal silhouette is unchanged with mild cardiomegaly. No airspace consolidation, edema,  pleural effusion, pneumothorax is identified. There is minimal atelectasis in the lung bases. No acute osseous abnormality is seen. IMPRESSION: No active cardiopulmonary disease. Electronically Signed   By: Logan Bores M.D.   On: 10/09/2018 22:01   Ct Head Wo Contrast  Result Date: 10/10/2018 CLINICAL DATA:  Headache for 3 days.  Vertigo. EXAM: CT HEAD WITHOUT CONTRAST TECHNIQUE: Contiguous axial images were obtained from the base of the skull through the vertex without intravenous contrast. COMPARISON:  None. FINDINGS: Brain: There is no mass, hemorrhage or extra-axial collection. The size and configuration of the ventricles and extra-axial CSF spaces are normal. The brain parenchyma is normal, without acute or chronic infarction. Vascular: No abnormal hyperdensity of the major intracranial arteries or dural venous sinuses. No intracranial atherosclerosis. Skull: The visualized skull base, calvarium and extracranial soft tissues are normal. Sinuses/Orbits: Opacification of the right sphenoid sinus. No mastoid effusion. Normal orbits. Other: None IMPRESSION: Normal brain. Electronically  Signed   By: Ulyses Jarred M.D.   On: 10/10/2018 02:27    Procedures Procedures   Medications Ordered in ED Medications  sodium chloride flush (NS) 0.9 % injection 3 mL (has no administration in time range)  sodium chloride 0.9 % bolus 1,000 mL (has no administration in time range)  meclizine (ANTIVERT) tablet 25 mg (has no administration in time range)  metoCLOPramide (REGLAN) injection 10 mg (has no administration in time range)  diphenhydrAMINE (BENADRYL) injection 25 mg (has no administration in time range)  LORazepam (ATIVAN) injection 1 mg (has no administration in time range)     Initial Impression / Assessment and Plan / ED Course  I have reviewed the triage vital signs and the nursing notes.  Pertinent labs & imaging results that were available during my care of the patient were reviewed by me and considered in my medical decision making (see chart for details).  Headache which seems most likely to be a migraine variant.  No red flags to suggest more serious causes of headache such as meningitis, subarachnoid hemorrhage.  Elevated blood pressure which is relatively modest and I doubt is causing her headache.  Vertigo which appears to be peripheral vertigo.  She will be given IV fluids, metoclopramide, diphenhydramine and will be given meclizine for dizziness.  Because she is anticoagulated, I will send her for CT of head.  She states that she needs sedation in order to go through a CT scan and she is given a dose of lorazepam.  Old records are reviewed confirming recent cardiology visit with change of medications as noted above.  She had excellent relief of her symptoms with above-noted treatment.  CT the head was normal and labs are unremarkable.  She is felt to be safe for discharge.  She is given a dose of dexamethasone to try to prevent rebound headache.  Recommended she follow-up with her cardiologist as scheduled and continue monitoring her blood pressure at home.  Final  Clinical Impressions(s) / ED Diagnoses   Final diagnoses:  Bad headache  Peripheral vertigo, unspecified laterality  Elevated blood pressure reading with diagnosis of hypertension  Chronic anticoagulation    ED Discharge Orders         Ordered    meclizine (ANTIVERT) 25 MG tablet  3 times daily PRN     10/10/18 [id number]           Delora Fuel, MD 15/17/61 [phone]

## 2018-10-10 NOTE — ED Notes (Signed)
PT states understanding of care given, follow up care, and medication prescribed. PT ambulated from ED to car with a steady gait. 

## 2018-10-13 ENCOUNTER — Telehealth: Payer: Self-pay | Admitting: Cardiovascular Disease

## 2018-10-13 DIAGNOSIS — I480 Paroxysmal atrial fibrillation: Secondary | ICD-10-CM

## 2018-10-13 MED ORDER — NEBIVOLOL HCL 5 MG PO TABS: 5.0000 mg | ORAL_TABLET | Freq: Two times a day (BID) | ORAL | 11 refills | Status: DC

## 2018-10-13 NOTE — Telephone Encounter (Signed)
OK to take the Bystolic 5 mg twice daily

## 2018-10-13 NOTE — Telephone Encounter (Signed)

## 2018-10-13 NOTE — Telephone Encounter (Signed)
Pt voiced understanding and order changed.

## 2018-10-13 NOTE — Telephone Encounter (Signed)
New Message  Pt c/o medication issue:  1. Name of Medication: nebivolol (BYSTOLIC) 2.5 MG tablet  2. How are you currently taking this medication (dosage and times per day)?  Take 1 tablet (2.5 mg total) by mouth daily. 3. Are you having a reaction (difficulty breathing--STAT)? Yes, SOB  4. What is your medication issue? Elevated Blood Pressure, Headaches everyday, SOB, swelling (hands and feet), went to the ER over the weekend due to blood pressure being so high. Currently has a headache and has dizziness, bp 148/96.

## 2018-10-13 NOTE — Telephone Encounter (Signed)
Spoke with pt who state she is still having c/o of a constant headache and elevated BP. Pt report over the weekend she was seen in ED because BP was 170/100. Today her BP is 148/96. Pt state at last OV, Dr. Loletha Grayer instructed her to decrease bystolic to 2.5 mg because it made her sick. Pt report she has since started taking 2.5 mg in the morning and at night but BP still remains elevated. Pt has a virtual appointment scheduled with MD tomorrow 9:30 am but inquiring as to what should she do in the meantime.   Will route to MD

## 2018-10-13 NOTE — Telephone Encounter (Signed)
LVM for pt to call back to give verbal consent for Virtual Visit on 10-14-18.

## 2018-10-13 NOTE — Progress Notes (Signed)
Carelink Summary Report / Loop Recorder 

## 2018-10-14 ENCOUNTER — Encounter: Payer: Self-pay | Admitting: Cardiovascular Disease

## 2018-10-14 ENCOUNTER — Telehealth (INDEPENDENT_AMBULATORY_CARE_PROVIDER_SITE_OTHER): Payer: BC Managed Care – PPO | Admitting: Cardiovascular Disease

## 2018-10-14 DIAGNOSIS — I480 Paroxysmal atrial fibrillation: Secondary | ICD-10-CM | POA: Diagnosis not present

## 2018-10-14 DIAGNOSIS — I10 Essential (primary) hypertension: Secondary | ICD-10-CM | POA: Diagnosis not present

## 2018-10-14 DIAGNOSIS — E669 Obesity, unspecified: Secondary | ICD-10-CM

## 2018-10-14 DIAGNOSIS — Z9889 Other specified postprocedural states: Secondary | ICD-10-CM | POA: Diagnosis not present

## 2018-10-14 DIAGNOSIS — Z6835 Body mass index (BMI) 35.0-35.9, adult: Secondary | ICD-10-CM

## 2018-10-14 MED ORDER — NEBIVOLOL HCL 10 MG PO TABS: 10.0000 mg | ORAL_TABLET | Freq: Every day | ORAL | 11 refills | Status: DC

## 2018-10-14 NOTE — Progress Notes (Signed)
Virtual Visit via Telephone Note   This visit type was conducted due to national recommendations for restrictions regarding the COVID-19 Pandemic (e.g. social distancing) in an effort to limit this patient's exposure and mitigate transmission in our community.  Due to her co-morbid illnesses, this patient is at least at moderate risk for complications without adequate follow up.  This format is felt to be most appropriate for this patient at this time.  The patient did not have access to video technology/had technical difficulties with video requiring transitioning to audio format only (telephone).  All issues noted in this document were discussed and addressed.  No physical exam could be performed with this format.  Please refer to the patient's chart for her  consent to telehealth [facility].   Date:  10/14/2018   ID:  Tina Robinson, DOB 07/05/1965, MRN [id number]  Patient Location: Other:  work Provider Location: Home  PCP:  Patient, No Pcp Per  Cardiologist:  Thurmon FairMihai Aikam Hellickson, MD  Electrophysiologist:  None   Evaluation Performed:  Follow-Up Visit  Chief Complaint:  AFib, HTN  History of Present Illness:    Tina HaberCarol Robinson is a 53 y.o. female with paroxysmal atrial flutter and atrial fibrillation, implantable loop recorder, hypertension, recent COVID-19 infection in for follow-up after emergency room visit with headache and hypertension.    At her last office appointment she was describing problems with hair loss and wanted to try to come off the diltiazem, which could have been the cause.  We switched to Bystolic.  About 3 days later she developed symptoms of peripheral vertigo, a severe headache and her blood pressure was markedly elevated at around 160/100, despite increasing the dose of Bystolic from our phone conversations.  She felt so poorly she went to the emergency room on July 19, but her work-up was benign.  EKG was normal.  Head CT showed no evidence of abnormalities.   Labs were within normal range.  She is feeling better and has returned to work today.  Her blood pressure (before taking the morning dose of medications) was 146/80 mmHg.  She still has a mild nagging retro-orbital headache.  She does not have any other neurological complaints.  She has not been troubled by rapid palpitations.  The patient does not have symptoms concerning for COVID-19 infection (fever, chills, cough, or new shortness of breath).    Past Medical History:  Diagnosis Date  . Atrial flutter, paroxysmal (HCC) 11/04/2016  . Benign essential HTN   . Murmur, cardiac    Past Surgical History:  Procedure Laterality Date  . CESAREAN SECTION    . LOOP RECORDER INSERTION N/A 11/26/2016   Procedure: LOOP RECORDER INSERTION;  Surgeon: Thurmon Fairroitoru, Arly Salminen, MD;  Location: [facility] INVASIVE CV LAB;  Service: Cardiovascular;  Laterality: N/A;     Current Meds  Medication Sig  . acetaminophen (TYLENOL) 500 MG tablet Take 500 mg by mouth every 6 (six) hours as needed for moderate pain.  . Cholecalciferol 20 MCG (800 UNIT) TABS Take 800 Units by mouth daily.  . meclizine (ANTIVERT) 25 MG tablet Take 1 tablet (25 mg total) by mouth 3 (three) times daily as needed for dizziness.  . nebivolol (BYSTOLIC) 5 MG tablet Take 1 tablet (5 mg total) by mouth 2 (two) times a day.  . rivaroxaban (XARELTO) 20 MG TABS tablet Take 1 tablet (20 mg total) by mouth daily with supper.     Allergies:   Mushroom extract complex   Social History   Tobacco  Use  . Smoking status: Never Smoker  . Smokeless tobacco: Never Used  Substance Use Topics  . Alcohol use: No  . Drug use: No     Family Hx: The patient's family history includes Hypertension in her father and mother; Peripheral Artery Disease in her mother; Stroke (age of onset: 17) in her father.  ROS:   Please see the history of present illness.    All other systems reviewed and are negative.   Prior CV studies:   The following studies were reviewed  today: ECG, CT head, labs from emergency room visit  Labs/Other Tests and Data Reviewed:    EKG:  An ECG dated 10/09/2018 was personally reviewed today and demonstrated:  Normal sinus rhythm, normal tracing  Recent Labs: 12/07/2017: ALT 17 10/06/2018: TSH 1.390 10/09/2018: Hemoglobin 12.0; Platelets 316 10/10/2018: BUN 14; Creatinine, Ser 0.85; Potassium 3.7; Sodium 139   Recent Lipid Panel Lab Results  Component Value Date/Time   CHOL 180 12/07/2017 11:34 AM   TRIG 99 12/07/2017 11:34 AM   HDL 61 12/07/2017 11:34 AM   CHOLHDL 3.0 12/07/2017 11:34 AM   CHOLHDL 2.8 11/05/2016 03:22 AM   LDLCALC 99 12/07/2017 11:34 AM    Wt Readings from Last 3 Encounters:  10/14/18 237 lb (107.5 kg)  10/09/18 235 lb 14.3 oz (107 kg)  10/05/18 237 lb 9.6 oz (107.8 kg)     Objective:    Vital Signs:  BP (!) 146/80   Pulse 88   Ht 5\' 7"  (1.702 m)   Wt 237 lb (107.5 kg)   LMP 01/12/2012   BMI 37.12 kg/m    VITAL SIGNS:  reviewed Unable to examine  ASSESSMENT & PLAN:    1. HTN: Hard to say whether the headache caused the hospitalization blood pressure vice versa, but most likely, the dose of Bystolic was insufficient to control her blood pressure.  She may do better with calcium channel blockers, but will continue to see how she does on the current dose of Bystolic for least a few more days.  She will send [place] a log of her blood pressure in a couple of weeks. 2.  AFib: No recent episodes of tachycardia or palpitations.  She tends to have extremely rapid AV node conduction, including periods that appear to be atrial flutter with 1: 1 conduction and heart rates over 200 beats a minute.  She needs to be on calcium channel blockers and or beta-blockers for her blood pressure and rate control.  She did not tolerate metoprolol as a daily medication due to fatigue.  She did better on diltiazem with reasonable rate control, but is worried that this may have caused hair loss.  Okay to take metoprolol  intermittently for breakthrough palpitations. 3. Anticoagulation: Well-tolerated, no evidence of intracranial hemorrhage on recent head CT 4.  Obesity: Severe, with comorbid conditions, blood pressure will likely be easier to manage if she loses additional weight.   COVID-19 Education: The signs and symptoms of COVID-19 were discussed with the patient and how to seek care for testing (follow up with PCP or arrange E-visit).  The importance of social distancing was discussed today.  Time:   Today, I have spent 16 minutes with the patient with telehealth technology discussing the above problems.     Medication Adjustments/Labs and Tests Ordered: Current medicines are reviewed at length with the patient today.  Concerns regarding medicines are outlined above.   Tests Ordered: No orders of the defined types were placed in this  encounter.   Medication Changes: No orders of the defined types were placed in this encounter.   Follow Up:  Virtual Visit or In Person 6 months  Signed, Thurmon FairMihai Arrington Bencomo, MD  10/14/2018 9:31 AM    [facility]

## 2018-10-14 NOTE — Addendum Note (Signed)
Addended by: Ricci Barker on: 10/14/2018 10:00 AM   Modules accepted: Orders

## 2018-10-14 NOTE — Patient Instructions (Addendum)
Medication Instructions:  Bystolic: You may take 10 mg once daily instead of 5 mg twice daily  If you need a refill on your cardiac medications before your next appointment, please call your pharmacy.   Lab work: None ordered I Testing/Procedures: None ordered  Follow-Up: At [facility], you and your health needs are our priority.  As part of our continuing mission to provide you with exceptional heart care, we have created designated Provider Care Teams.  These Care Teams include your primary Cardiologist (physician) and Advanced Practice Providers (APPs -  Physician Assistants and Nurse Practitioners) who all work together to provide you with the care you need, when you need it. You will need a follow up appointment in 6 months.  Please call our office 2 months in advance to schedule this appointment.  You may see Sanda Klein, MD or one of the following Advanced Practice Providers on your designated Care Team: Almyra Deforest, [place] Fabian Sharp, [place]  Any Other Special Instructions Will Be Listed Below (If Applicable). Please keep a daily log of your blood pressures and heart rate. Send [place] a MyChart message in 2 weeks or call at [phone] with the readings. Thank you!

## 2018-11-04 ENCOUNTER — Telehealth: Payer: Self-pay | Admitting: Cardiovascular Disease

## 2018-11-04 MED ORDER — HYDROCHLOROTHIAZIDE 12.5 MG PO CAPS: 12.5000 mg | ORAL_CAPSULE | Freq: Every day | ORAL | 3 refills | Status: DC

## 2018-11-04 MED ORDER — CARVEDILOL 6.25 MG PO TABS: 6.2500 mg | ORAL_TABLET | Freq: Two times a day (BID) | ORAL | 3 refills | Status: DC

## 2018-11-04 NOTE — Telephone Encounter (Signed)
Patient is called about   Pt c/o medication issue:  1. Name of Medication: nebivolol (BYSTOLIC) 10 MG tablet  2. How are you currently taking this medication (dosage and times per day)?Take 1 tablet (10 mg total) by mouth daily.  3. Are you having a reaction (difficulty breathing--STAT)? Yes  4. What is your medication issue? Patient states she has a rash on her arm and its draining, she noticed the rash about 3 days after starting the new medication.  She also states she is retaining fluid, she is not using the bathroom as often. She feels tight on her hands and feet.

## 2018-11-04 NOTE — Telephone Encounter (Signed)
Started new medication about 2 weeks started ago, but the past week she has noticed a rash, its red/itchy/draining. Patient denies new soap, foods, or other new medications, no new SOB  Patient states she also feels that her hands and feet are tight feeling like she is retaining fluid-  I asked if she had gained weight so she was checking while on the phone with me. She was 241lb- she has gained about 4 to 5 lbs since last visit. Patient has taken medication today already today. Please advise?

## 2018-11-04 NOTE — Telephone Encounter (Signed)
Called and notified patient of message from PharmD. Sent in new RX to pharmacy, Made appointment with APP in 3 weeks. Patient will call back with any issues.

## 2018-11-04 NOTE — Telephone Encounter (Signed)
Patient unable to tolerate metoprolol daily d/t fatigue and intolerant to diltiazem d/t hair loss. Will not initiate amlodipine due to low extremity edema.  Recommendations:  1. STOP taking bystolic  2. Resume taking HCTZ 12.5mg  daily  3. START taking carvedilol 6.25mg  twice daily  4. Monitor BP and HR daily (keep records)  5. Continue low sodium diet  6. Schedule 2-3 weeks follow up with APP or HTN clinic if patient agreeable.

## 2018-11-08 ENCOUNTER — Ambulatory Visit (INDEPENDENT_AMBULATORY_CARE_PROVIDER_SITE_OTHER): Payer: BC Managed Care – PPO | Admitting: *Deleted

## 2018-11-08 DIAGNOSIS — I480 Paroxysmal atrial fibrillation: Secondary | ICD-10-CM | POA: Diagnosis not present

## 2018-11-08 LAB — CUP PACEART REMOTE DEVICE CHECK
Date Time Interrogation Session: 20200818173701
Implantable Pulse Generator Implant Date: 20180906

## 2018-11-16 NOTE — Progress Notes (Signed)
Carelink Summary Report / Loop Recorder 

## 2018-11-18 DIAGNOSIS — N950 Postmenopausal bleeding: Secondary | ICD-10-CM | POA: Diagnosis not present

## 2018-11-18 DIAGNOSIS — N840 Polyp of corpus uteri: Secondary | ICD-10-CM | POA: Diagnosis not present

## 2018-11-18 DIAGNOSIS — Z7901 Long term (current) use of anticoagulants: Secondary | ICD-10-CM | POA: Diagnosis not present

## 2018-11-25 ENCOUNTER — Ambulatory Visit: Payer: BC Managed Care – PPO | Admitting: Physician Assistant

## 2018-12-02 IMAGING — CR DG CHEST 2V
2 series · 2 of 2 positions shown · non-contrast
Comparison: Chest radiograph performed 11/04/2016

CLINICAL DATA: Acute onset of generalized chest heaviness and
shortness of breath. Nausea. Right arm pain radiating to the right
neck.

EXAM:
CHEST - 2 VIEW

[chest pa]
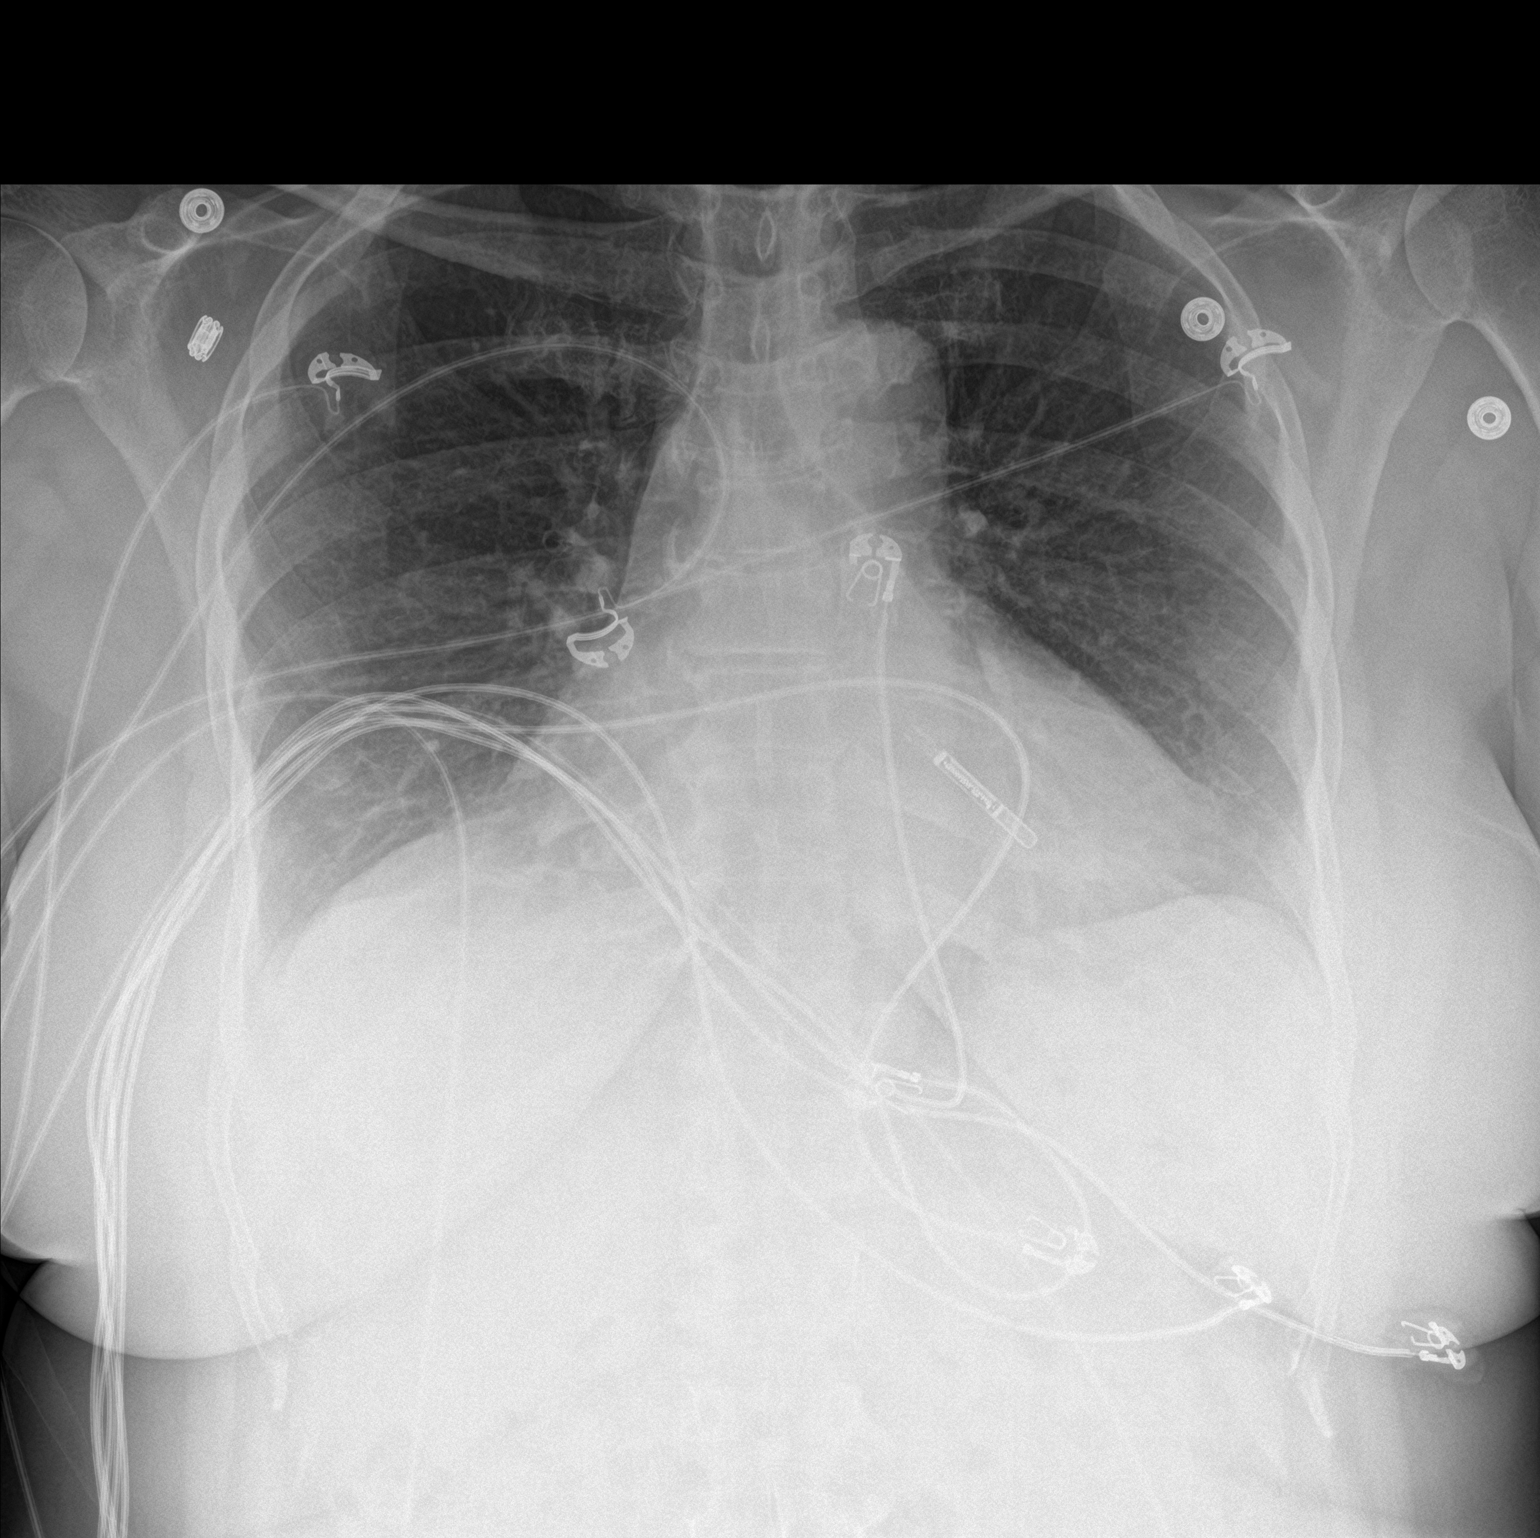

[chest lat]
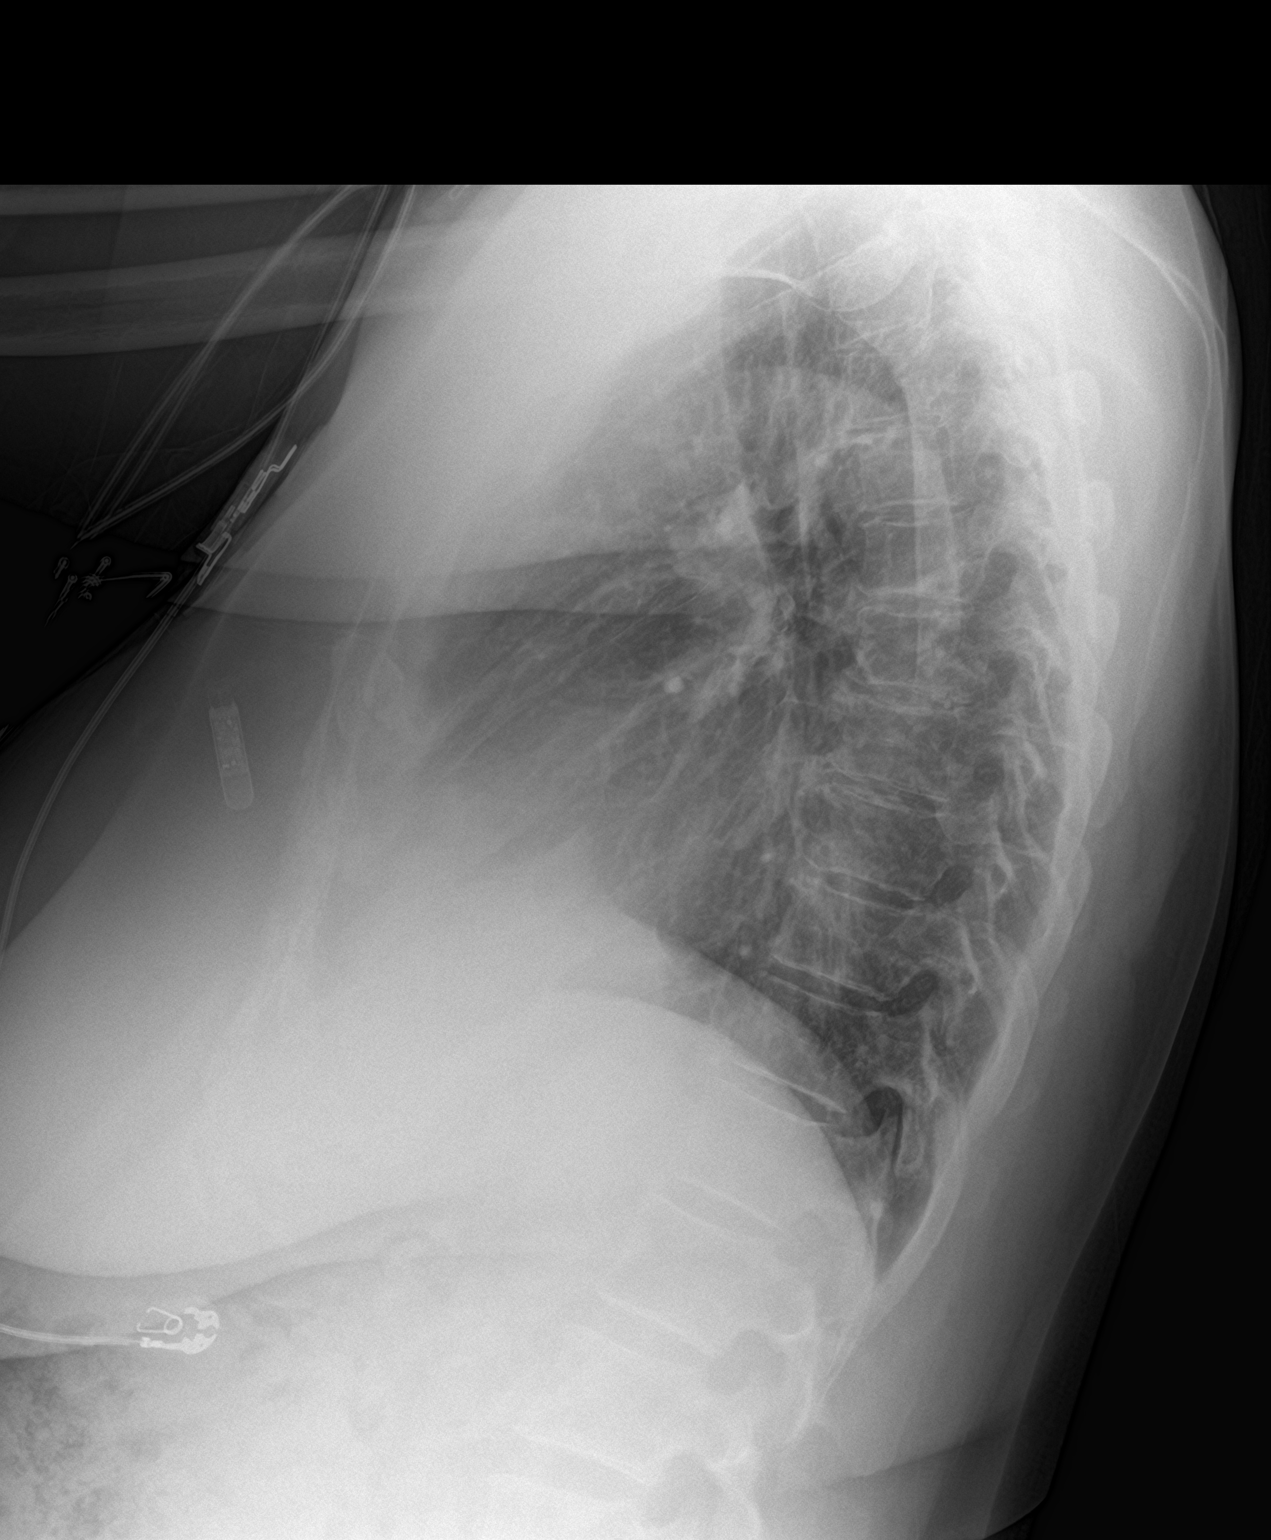

[2 of 2 positions shown; findings below may reference images not displayed]

FINDINGS: The lungs are well-aerated and clear. There is no evidence of focal
opacification, pleural effusion or pneumothorax.

The heart is mildly enlarged. A loop recorder is noted. No acute
osseous abnormalities are seen.
IMPRESSION: Mild cardiomegaly. Lungs remain grossly clear.

## 2018-12-04 NOTE — Progress Notes (Signed)
Cardiology Office Note   Date:  12/05/2018   ID:  Tina Robinson, DOB 03/21/1966, MRN [id number]  PCP:  Patient, No Pcp Per  Cardiologist:  Sanda Klein, MD EP: None  Chief Complaint  Patient presents with  . Follow-up    HTN, Occassional chest pain/tightness and shortness of breath       History of Present Illness: Tina Robinson is a 53 y.o. female with a PMH of HTN and paroxysmal atrial fibrillation, who presents for follow-up of her HTN and SOB.  She was last evaluated by cardiology via a telemedicine visit with Dr. Sallyanne Kuster 10/14/2018, at which time she was feeling better after a recent trip to the ER for HA, vertigo, and elevated blood pressures. She was recommended to keep a log of her blood pressures on bystolic to determine if additional medications were needed. She called the office 11/04/2018 to report a new rash which started after taking bystolic, as well as LE edema. She was recommended to stop bystolic and start carvedilol 6.25mg  BID and HCTZ 12.5mg  daily for HTN, PAF, and LE edema management. She has a loop recorder with last interrogation 11/08/2018 with very low burden AF (improved from previous months). Echocardiogram 11/2017 with EF 60-65%, G1DD, no RWMA, and mild MR. Her last ischemic evaluation was a ETT in 2018 which was without ischemia.   She returns today for follow-up of her HTN and SOB. She has been feeling sub par since she had a viral illness in late March which she feels was likely COVID-19. She has had difficulty with DOE and fatigue since that time. Recently she has had some LE edema for which she has been taking prn HCTZ with improvement. She has occasional sharp, fleeting chest pains which occur while laying in bed and seem to be positional. Also with occasional chest tightness/pressure which occur without rhyme or reason and resolve within a couple minutes with deep breathing exercises. She denies family history of CAD. Risk factors for CAD include HTN and  obesity. Also with complaints of PND, snoring, and daytime somnolence. She is concerned she may have sleep apnea. No complaints of bleeding with xarelto.    Past Medical History:  Diagnosis Date  . Atrial flutter, paroxysmal ([place]) 11/04/2016  . Benign essential HTN   . Murmur, cardiac     Past Surgical History:  Procedure Laterality Date  . CESAREAN SECTION    . LOOP RECORDER INSERTION N/A 11/26/2016   Procedure: LOOP RECORDER INSERTION;  Surgeon: Sanda Klein, MD;  Location: [facility];  Service: Cardiovascular;  Laterality: N/A;     Current Outpatient Medications  Medication Sig Dispense Refill  . carvedilol (COREG) 6.25 MG tablet Take 1 tablet (6.25 mg total) by mouth 2 (two) times daily. 180 tablet 3  . hydrochlorothiazide (MICROZIDE) 12.5 MG capsule Take 1 capsule (12.5 mg total) by mouth daily. 90 capsule 3  . meclizine (ANTIVERT) 25 MG tablet Take 1 tablet (25 mg total) by mouth 3 (three) times daily as needed for dizziness. 30 tablet 0  . metoprolol tartrate (LOPRESSOR) 25 MG tablet TAKE 1 TABLET (25 MG TOTAL) BY MOUTH DAILY AS NEEDED. (Patient taking differently: Take 25 mg by mouth daily as needed (blood pressure). ) 30 tablet 0  . rivaroxaban (XARELTO) 20 MG TABS tablet Take 1 tablet (20 mg total) by mouth daily with supper. 90 tablet 3   No current facility-administered medications for this visit.     Allergies:   Mushroom extract complex  Social History:  The patient  reports that she has never smoked. She has never used smokeless tobacco. She reports that she does not drink alcohol or use drugs.   Family History:  The patient's family history includes Hypertension in her father and mother; Peripheral Artery Disease in her mother; Stroke (age of onset: 485) in her father.    ROS:  Please see the history of present illness.   Otherwise, review of systems are positive for none.   All other systems are reviewed and negative.    PHYSICAL EXAM: VS:  BP  128/86   Pulse 82   Temp 97.9 F (36.6 C) (Temporal)   Ht 5' 7.5" (1.715 m)   Wt 240 lb (108.9 kg)   LMP 01/12/2012   BMI 37.03 kg/m  , BMI Body mass index is 37.03 kg/m. GEN: Well nourished, well developed, in no acute distress HEENT: sclera anicteric Neck: no JVD, carotid bruits, or masses Cardiac: RRR; no murmurs, rubs, or gallops, trace non-pitting edema  Respiratory:  clear to auscultation bilaterally, normal work of breathing GI: soft, nontender, nondistended, + BS MS: no deformity or atrophy Skin: warm and dry, no rash Neuro:  Strength and sensation are intact Psych: euthymic mood, full affect   EKG:  EKG is ordered today. The ekg ordered today demonstrates NSR, no STE/D, no TWI, QTc 460; no significant change from previous.    Recent Labs: 12/07/2017: ALT 17 10/06/2018: TSH 1.390 10/09/2018: Hemoglobin 12.0; Platelets 316 10/10/2018: BUN 14; Creatinine, Ser 0.85; Potassium 3.7; Sodium 139    Lipid Panel    Component Value Date/Time   CHOL 180 12/07/2017 1134   TRIG 99 12/07/2017 1134   HDL 61 12/07/2017 1134   CHOLHDL 3.0 12/07/2017 1134   CHOLHDL 2.8 11/05/2016 0322   VLDL 13 11/05/2016 0322   LDLCALC 99 12/07/2017 1134      Wt Readings from Last 3 Encounters:  12/05/18 240 lb (108.9 kg)  10/14/18 237 lb (107.5 kg)  10/09/18 235 lb 14.3 oz (107 kg)      Other studies Reviewed: Additional studies/ records that were reviewed today include:   Echocardiogram 11/2017: Study Conclusions  - Left ventricle: The cavity size was normal. Systolic function was   normal. The estimated ejection fraction was in the range of 60%   to 65%. Wall motion was normal; there were no regional wall   motion abnormalities. Doppler parameters are consistent with   abnormal left ventricular relaxation (grade 1 diastolic   dysfunction). There was no evidence of elevated ventricular   filling pressure by Doppler parameters. - Aortic valve: There was no regurgitation. -  Aortic root: The aortic root was normal in size. - Mitral valve: There was mild regurgitation. - Left atrium: The atrium was mildly dilated. - Right ventricle: Systolic function was normal. - Right atrium: The atrium was normal in size. - Tricuspid valve: There was no regurgitation. - Pulmonary arteries: Systolic pressure could not be accurately   estimated. - Inferior vena cava: The vessel was normal in size. - Pericardium, extracardiac: There was no pericardial effusion.  ETT 2018:  Blood pressure demonstrated a hypertensive response to exercise.  There was no ST segment deviation noted during stress.   Normal exercise stress test. No ischemia. Mildly decreased exercise capacity. Hypertensive response to exercise (211/85 mmHg).   ASSESSMENT AND PLAN:  1. Atypical chest pain: she reports both sharp pains at night which are positional, as well as intermittent chest pressure/tightness which does not sound exertional  in nature and improve with deep breathing. ETT 2 years ago was without ischemia. Echo 1 year ago with normal EF and no RWMA. Risk factors for CAD include HTN and obesity - Will check FLP and HgbA1C for risk assessment - Favor watchful waiting given atypical symptoms, thought would have a low threshold for coronary CTA if symptoms increase in frequency or begin occurring with exertion.   2. HTN: BP 128/86 today. She has only been taking HCTZ as needed for LE edema. - Continue carvedilol 6.25mg  BID - if fatigue persists and sleep study negative, could consider trial of metoprolol to see if her symptoms improve.  - Recommended she take HCTZ 12.5mg  daily    3. PAF: very low AF burden on last loop recorder check 11/08/2018.  - Continue carvedilol for rate control.Also has prn metoprolol if HR persistently elevated. - Continue xarelto for stroke ppx  4. LE edema and DOE likely 2/2 chronic diastolic CHF: Echo 1 year ago with G1DD. She noted improvement in LE edema HCTZ, though  has only been taking prn.  - Encourage patient to take HCTZ daily - Continue carvedilol  - Continue to encourage low sodium diet.   5. Suspected OSA: patient reports snoring, almost nightly PND, and daytime somnolence. No prior sleep studies.  - Will plan for a sleep study to further evaluate her symptoms   Current medicines are reviewed at length with the patient today.  The patient does not have concerns regarding medicines.  The following changes have been made:  Take HCTZ 12.5mg  daily  Labs/ tests ordered today include:   Orders Placed This Encounter  Procedures  . Hemoglobin A1c  . Lipid panel  . EKG 12-Lead  . Split night study     Disposition:   FU with Dr. Royann Shivers in 4 months  Signed, Beatriz Stallion, PA-C  12/05/2018 10:44 AM

## 2018-12-05 ENCOUNTER — Encounter: Payer: Self-pay | Admitting: Medical

## 2018-12-05 ENCOUNTER — Ambulatory Visit (INDEPENDENT_AMBULATORY_CARE_PROVIDER_SITE_OTHER): Payer: BC Managed Care – PPO | Admitting: Medical

## 2018-12-05 ENCOUNTER — Other Ambulatory Visit: Payer: Self-pay

## 2018-12-05 VITALS — BP 128/86 | HR 82 | Temp 97.9°F | Ht 67.5 in | Wt 240.0 lb

## 2018-12-05 DIAGNOSIS — E669 Obesity, unspecified: Secondary | ICD-10-CM

## 2018-12-05 DIAGNOSIS — R0600 Dyspnea, unspecified: Secondary | ICD-10-CM

## 2018-12-05 DIAGNOSIS — I480 Paroxysmal atrial fibrillation: Secondary | ICD-10-CM | POA: Diagnosis not present

## 2018-12-05 DIAGNOSIS — R400 Somnolence: Secondary | ICD-10-CM | POA: Diagnosis not present

## 2018-12-05 DIAGNOSIS — Z9889 Other specified postprocedural states: Secondary | ICD-10-CM

## 2018-12-05 DIAGNOSIS — R0789 Other chest pain: Secondary | ICD-10-CM | POA: Diagnosis not present

## 2018-12-05 DIAGNOSIS — I10 Essential (primary) hypertension: Secondary | ICD-10-CM

## 2018-12-05 NOTE — Patient Instructions (Addendum)
Medication Instructions:  TAKE Hydrochlorothiazide once a day  If you need a refill on your cardiac medications before your next appointment, please call your pharmacy.   Lab work: Your physician recommends that you return for lab work in: LIPIDS, A1C If you have labs (blood work) drawn today and your tests are completely normal, you will receive your results only by: Marland Kitchen MyChart Message (if you have MyChart) OR . A paper copy in the mail If you have any lab test that is abnormal or we need to change your treatment, we will call you to review the results.  Testing/Procedures: Your physician has recommended that you have a sleep study. This test records several body functions during sleep, including: brain activity, eye movement, oxygen and carbon dioxide blood levels, heart rate and rhythm, breathing rate and rhythm, the flow of air through your mouth and nose, snoring, body muscle movements, and chest and belly movement. WANDA OUR SLEEP COORDINATOR WILL BE IN CONTACT WITH YOU ABOUT YOUR SLEEP STUDY   Follow-Up: At [facility], you and your health needs are our priority.  As part of our continuing mission to provide you with exceptional heart care, we have created designated Provider Care Teams.  These Care Teams include your primary Cardiologist (physician) and Advanced Practice Providers (APPs -  Physician Assistants and Nurse Practitioners) who all work together to provide you with the care you need, when you need it. You will need a follow up appointment in 4 months.  Please call our office 2 months in advance to schedule this appointment.  You may see Sanda Klein, MD or one of the following Advanced Practice Providers on your designated Care Team: [place], [place] . Fabian Sharp, PA-C  Any Other Special Instructions Will Be Listed Below (If Applicable).

## 2018-12-05 NOTE — Progress Notes (Signed)
Thankks 

## 2018-12-06 DIAGNOSIS — R0789 Other chest pain: Secondary | ICD-10-CM | POA: Diagnosis not present

## 2018-12-06 DIAGNOSIS — R400 Somnolence: Secondary | ICD-10-CM | POA: Diagnosis not present

## 2018-12-12 ENCOUNTER — Ambulatory Visit (INDEPENDENT_AMBULATORY_CARE_PROVIDER_SITE_OTHER): Payer: BC Managed Care – PPO | Admitting: *Deleted

## 2018-12-12 DIAGNOSIS — I480 Paroxysmal atrial fibrillation: Secondary | ICD-10-CM

## 2018-12-12 LAB — CUP PACEART REMOTE DEVICE CHECK
Date Time Interrogation Session: 20200920184100
Implantable Pulse Generator Implant Date: 20180906

## 2018-12-20 NOTE — Progress Notes (Signed)
Carelink Summary Report / Loop Recorder 

## 2018-12-30 ENCOUNTER — Other Ambulatory Visit (HOSPITAL_COMMUNITY)
Admission: RE | Admit: 2018-12-30 | Discharge: 2018-12-30 | Disposition: A | Discharge status: Home / Self Care | Payer: BC Managed Care – PPO | Source: Ambulatory Visit | Attending: Cardiovascular Disease | Admitting: Cardiovascular Disease

## 2018-12-30 DIAGNOSIS — Z01812 Encounter for preprocedural laboratory examination: Secondary | ICD-10-CM | POA: Diagnosis not present

## 2018-12-30 DIAGNOSIS — Z20828 Contact with and (suspected) exposure to other viral communicable diseases: Secondary | ICD-10-CM | POA: Insufficient documentation

## 2019-01-02 ENCOUNTER — Ambulatory Visit (HOSPITAL_BASED_OUTPATIENT_CLINIC_OR_DEPARTMENT_OTHER): Payer: BC Managed Care – PPO | Attending: Medical | Admitting: Cardiovascular Disease

## 2019-01-02 ENCOUNTER — Other Ambulatory Visit: Payer: Self-pay

## 2019-01-02 VITALS — Ht 67.0 in | Wt 234.0 lb

## 2019-01-02 LAB — NOVEL CORONAVIRUS, NAA (HOSP ORDER, SEND-OUT TO REF LAB; TAT 18-24 HRS): SARS-CoV-2, NAA: NOT DETECTED

## 2019-01-02 NOTE — Procedures (Signed)
Pt  Was unable to complete the study because of sever claustrophobia.

## 2019-01-13 ENCOUNTER — Ambulatory Visit (INDEPENDENT_AMBULATORY_CARE_PROVIDER_SITE_OTHER): Payer: BC Managed Care – PPO | Admitting: *Deleted

## 2019-01-13 ENCOUNTER — Other Ambulatory Visit: Payer: Self-pay

## 2019-01-13 DIAGNOSIS — I480 Paroxysmal atrial fibrillation: Secondary | ICD-10-CM | POA: Diagnosis not present

## 2019-01-15 LAB — CUP PACEART REMOTE DEVICE CHECK
Date Time Interrogation Session: 20201023184556
Implantable Pulse Generator Implant Date: 20180906

## 2019-01-23 NOTE — Progress Notes (Signed)
Carelink Summary Report / Loop Recorder 

## 2019-02-15 ENCOUNTER — Ambulatory Visit (INDEPENDENT_AMBULATORY_CARE_PROVIDER_SITE_OTHER): Payer: BC Managed Care – PPO | Admitting: *Deleted

## 2019-02-15 DIAGNOSIS — I480 Paroxysmal atrial fibrillation: Secondary | ICD-10-CM | POA: Diagnosis not present

## 2019-02-15 LAB — CUP PACEART REMOTE DEVICE CHECK
Date Time Interrogation Session: 20201125134516
Implantable Pulse Generator Implant Date: 20180906

## 2019-03-14 NOTE — Progress Notes (Signed)
ILR remote 

## 2019-03-20 ENCOUNTER — Ambulatory Visit (INDEPENDENT_AMBULATORY_CARE_PROVIDER_SITE_OTHER): Payer: BC Managed Care – PPO | Admitting: *Deleted

## 2019-03-20 DIAGNOSIS — I480 Paroxysmal atrial fibrillation: Secondary | ICD-10-CM

## 2019-03-20 LAB — CUP PACEART REMOTE DEVICE CHECK
Date Time Interrogation Session: 20201228144449
Implantable Pulse Generator Implant Date: 20180906

## 2019-03-22 ENCOUNTER — Other Ambulatory Visit: Payer: Self-pay | Admitting: Cardiovascular Disease

## 2019-03-23 NOTE — Telephone Encounter (Signed)
Rx request sent to pharmacy.  

## 2019-03-27 ENCOUNTER — Ambulatory Visit (INDEPENDENT_AMBULATORY_CARE_PROVIDER_SITE_OTHER): Payer: BC Managed Care – PPO | Admitting: Cardiovascular Disease

## 2019-03-27 ENCOUNTER — Other Ambulatory Visit: Payer: Self-pay

## 2019-03-27 ENCOUNTER — Encounter: Payer: Self-pay | Admitting: Cardiovascular Disease

## 2019-03-27 VITALS — BP 122/88 | HR 96 | Temp 95.7°F | Ht 67.5 in | Wt 241.0 lb

## 2019-03-27 DIAGNOSIS — Z7901 Long term (current) use of anticoagulants: Secondary | ICD-10-CM

## 2019-03-27 DIAGNOSIS — I480 Paroxysmal atrial fibrillation: Secondary | ICD-10-CM | POA: Diagnosis not present

## 2019-03-27 DIAGNOSIS — I10 Essential (primary) hypertension: Secondary | ICD-10-CM

## 2019-03-27 DIAGNOSIS — Z6835 Body mass index (BMI) 35.0-35.9, adult: Secondary | ICD-10-CM

## 2019-03-27 MED ORDER — CARVEDILOL 3.125 MG PO TABS: 3.1250 mg | ORAL_TABLET | Freq: Two times a day (BID) | ORAL | 3 refills | Status: DC

## 2019-03-27 NOTE — Patient Instructions (Signed)
Medication Instructions:  DECREASE the Carvedilol to 3.125 mg twice daily  *If you need a refill on your cardiac medications before your next appointment, please call your pharmacy*  Lab Work: None ordered If you have labs (blood work) drawn today and your tests are completely normal, you will receive your results only by: Marland Kitchen MyChart Message (if you have MyChart) OR . A paper copy in the mail If you have any lab test that is abnormal or we need to change your treatment, we will call you to review the results.  Testing/Procedures: None ordered  Follow-Up: At [facility], you and your health needs are our priority.  As part of our continuing mission to provide you with exceptional heart care, we have created designated Provider Care Teams.  These Care Teams include your primary Cardiologist (physician) and Advanced Practice Providers (APPs -  Physician Assistants and Nurse Practitioners) who all work together to provide you with the care you need, when you need it.  Your next appointment:   12 month(s)  The format for your next appointment:   In Person  Provider:   Thurmon Fair, MD

## 2019-03-27 NOTE — Progress Notes (Signed)
Cardiology Office Note:    Date:  03/29/2019   ID:  Tina Robinson, DOB May 08, 1965, MRN [id number]  PCP:  Patient, No Pcp Per  Cardiologist:  Sanda Klein, MD    Referring MD: No ref. provider found   Chief Complaint  Patient presents with  . Follow-up    6 months.  . Fatigue  Follow-up atrial fibrillation  History of Present Illness:    Tina Robinson is a 54 y.o. female with a hx of paroxysmal atypical atrial flutter and atrial fibrillation, status post implantable loop recorder, returning for follow-up.  Tina Robinson is recovering from COVID-19 infection.  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  She had unrelenting fever for a couple of weeks, severe malaise and myalgias, but never had problems with respiration.  She still feels very weak.  Although she has returned to work she can only work 2 days a week.  She complains of severe fatigue, headaches and reports that her hair is falling out in clumps.  She is doing much better, but has fatigue. No palpitations recently. Denies dyspnea, syncope, chest pain, edema, neurological events, bleeding, edema.  The loop recorder shows a very low burden of atrial arrhythmia (had a flare-up in March April during acute illness)..  Past Medical History:  Diagnosis Date  . Atrial flutter, paroxysmal ([place]) 11/04/2016  . Benign essential HTN   . Murmur, cardiac     Past Surgical History:  Procedure Laterality Date  . CESAREAN SECTION    . LOOP RECORDER INSERTION N/A 11/26/2016   Procedure: LOOP RECORDER INSERTION;  Surgeon: Sanda Klein, MD;  Location: [place] [facility];  Service: Cardiovascular;  Laterality: N/A;    Current Medications: Current Meds  Medication Sig  . carvedilol (COREG) 3.125 MG tablet Take 1 tablet (3.125 mg total) by mouth 2 (two) times daily.  . hydrochlorothiazide (MICROZIDE) 12.5 MG capsule Take 1 capsule (12.5 mg total) by mouth daily.  . meclizine (ANTIVERT) 25 MG tablet Take 1 tablet (25 mg  total) by mouth 3 (three) times daily as needed for dizziness.  Alveda Reasons 20 MG TABS tablet TAKE 1 TABLET (20 MG TOTAL) BY MOUTH DAILY WITH SUPPER.  . [DISCONTINUED] carvedilol (COREG) 6.25 MG tablet Take 1 tablet (6.25 mg total) by mouth 2 (two) times daily.     Allergies:   Mushroom extract complex   Social History   Socioeconomic History  . Marital status: Single    Spouse name: Not on file  . Number of children: Not on file  . Years of education: Not on file  . Highest education level: Not on file  Occupational History  . Not on file  Tobacco Use  . Smoking status: Never Smoker  . Smokeless tobacco: Never Used  Substance and Sexual Activity  . Alcohol use: No  . Drug use: No  . Sexual activity: Not on file  Other Topics Concern  . Not on file  Social History Narrative   Lives with and helps care for 2 grandchildren and her son   Social Determinants of Health   Financial Resource Strain:   . Difficulty of Paying Living Expenses: Not on file  Food Insecurity:   . Worried About Charity fundraiser in the Last Year: Not on file  . Ran Out of Food in the Last Year: Not on file  Transportation Needs:   . Lack of Transportation (Medical): Not on file  . Lack of Transportation (Non-Medical): Not on file  Physical  Activity:   . Days of Exercise per Week: Not on file  . Minutes of Exercise per Session: Not on file  Stress:   . Feeling of Stress : Not on file  Social Connections:   . Frequency of Communication with Friends and Family: Not on file  . Frequency of Social Gatherings with Friends and Family: Not on file  . Attends Religious Services: Not on file  . Active Member of Clubs or Organizations: Not on file  . Attends Banker Meetings: Not on file  . Marital Status: Not on file     Family History: The patient's family history includes Hypertension in her father and mother; Peripheral Artery Disease in her mother; Stroke (age of onset: 45) in her  father.  ROS:   Please see the history of present illness.    All other systems are reviewed and are negative.   EKGs/Labs/Other Studies Reviewed:    The following studies were reviewed today: Loop recorder download  EKG:  EKG is ordered today. It shows SR w PACs, left atrial abnormality  Recent Labs: 10/06/2018: TSH 1.390 10/09/2018: Hemoglobin 12.0; Platelets 316 10/10/2018: BUN 14; Creatinine, Ser 0.85; Potassium 3.7; Sodium 139  Recent Lipid Panel    Component Value Date/Time   CHOL 180 12/07/2017 1134   TRIG 99 12/07/2017 1134   HDL 61 12/07/2017 1134   CHOLHDL 3.0 12/07/2017 1134   CHOLHDL 2.8 11/05/2016 0322   VLDL 13 11/05/2016 0322   LDLCALC 99 12/07/2017 1134    Physical Exam:    VS:  BP 122/88 (BP Location: Left Arm, Patient Position: Sitting, Cuff Size: Large)   Pulse 96   Temp (!) 95.7 F (35.4 C)   Ht 5' 7.5" (1.715 m)   Wt 241 lb (109.3 kg)   LMP 01/12/2012   BMI 37.19 kg/m     Wt Readings from Last 3 Encounters:  03/27/19 241 lb (109.3 kg)  01/02/19 234 lb (106.1 kg)  12/05/18 240 lb (108.9 kg)     General: Alert, oriented x3, no distress, obese Head: no evidence of trauma, PERRL, EOMI, no exophtalmos or lid lag, no myxedema, no xanthelasma; normal ears, nose and oropharynx Neck: normal jugular venous pulsations and no hepatojugular reflux; brisk carotid pulses without delay and no carotid bruits Chest: clear to auscultation, no signs of consolidation by percussion or palpation, normal fremitus, symmetrical and full respiratory excursions Cardiovascular: normal position and quality of the apical impulse, regular rhythm, normal first and second heart sounds, no murmurs, rubs or gallops Abdomen: no tenderness or distention, no masses by palpation, no abnormal pulsatility or arterial bruits, normal bowel sounds, no hepatosplenomegaly Extremities: no clubbing, cyanosis or edema; 2+ radial, ulnar and brachial pulses bilaterally; 2+ right femoral,  posterior tibial and dorsalis pedis pulses; 2+ left femoral, posterior tibial and dorsalis pedis pulses; no subclavian or femoral bruits Neurological: grossly nonfocal Psych: Normal mood and affect   ASSESSMENT:    1. PAF (paroxysmal atrial fibrillation) (HCC)   2. Long term current use of anticoagulant   3. Essential hypertension   4. Severe obesity (BMI 35.0-35.9 with comorbidity) (HCC)    PLAN:    In order of problems listed above:  1. Paroxysmal atrial flutter and fibrillation: Infrequent, currently asymptomatic.  She is appropriately anticoagulated.  CHADSVasc 2 (gender, hypertension).  Rate control is mediocre, but the episodes are infrequent. 2. Anticoagulation: no bleeding complications. 3. HTN: fair control, reduce carvedilol due to fatigue. Send BP readings via mychart in 2 weeks.  4. Severe obesity with comorbid conditions: Discussed the importance of weight loss to reduce the burden of arrhythmia.    Medication Adjustments/Labs and Tests Ordered: Current medicines are reviewed at length with the patient today.  Concerns regarding medicines are outlined above.  Orders Placed This Encounter  Procedures  . EKG 12-Lead   Meds ordered this encounter  Medications  . carvedilol (COREG) 3.125 MG tablet    Sig: Take 1 tablet (3.125 mg total) by mouth 2 (two) times daily.    Dispense:  180 tablet    Refill:  3    Signed, Thurmon Fair, MD  03/29/2019 4:06 PM    [facility]

## 2019-03-29 ENCOUNTER — Encounter: Payer: Self-pay | Admitting: Cardiovascular Disease

## 2019-04-20 ENCOUNTER — Ambulatory Visit (INDEPENDENT_AMBULATORY_CARE_PROVIDER_SITE_OTHER): Payer: BC Managed Care – PPO | Admitting: *Deleted

## 2019-04-20 DIAGNOSIS — I480 Paroxysmal atrial fibrillation: Secondary | ICD-10-CM | POA: Diagnosis not present

## 2019-04-20 NOTE — Progress Notes (Signed)
error 

## 2019-04-21 LAB — CUP PACEART REMOTE DEVICE CHECK
Date Time Interrogation Session: 20210128154845
Implantable Pulse Generator Implant Date: 20180906

## 2019-05-08 ENCOUNTER — Telehealth: Payer: Self-pay | Admitting: Emergency Medicine

## 2019-05-08 NOTE — Telephone Encounter (Signed)
Alert received for episode of AF with RVR that occurred on 2/12/21at 1236  With average v-rate of 143 that lasted 1 hour and 56 minutes. The patient reports she felt the episode and had some SOB, no CP , no syncope. She had her 2nd dose of Covid vaccine on 05/04/19 around 1200 and had no energy , muscle aches and reports she felt like she did when she had COVID last year until 05/07/19. Patient reports no missed doses of metoprolol or Xarelto.

## 2019-05-08 NOTE — Telephone Encounter (Signed)
Thanks. No change in treatment plan. 

## 2019-05-18 IMAGING — CT CT ANGIOGRAPHY CHEST
2 of 7 series · 18 of 46 positions shown · IV contrast (omnipaque)
Comparison: None.

CLINICAL DATA: Cough, chest pain, short of breath.

EXAM:
CT ANGIOGRAPHY CHEST WITH CONTRAST
TECHNIQUE: Multidetector CT imaging of the chest was performed using the
standard protocol during bolus administration of intravenous
contrast. Multiplanar CT image reconstructions and MIPs were
obtained to evaluate the vascular anatomy.
CONTRAST:  100mL OMNIPAQUE IOHEXOL 350 MG/ML SOLN

[Series 7: thins · axial · 0.77mm/px · z∈[+40,+268]mm · 15 of 368 slices shown]
[im 21/368  lung]
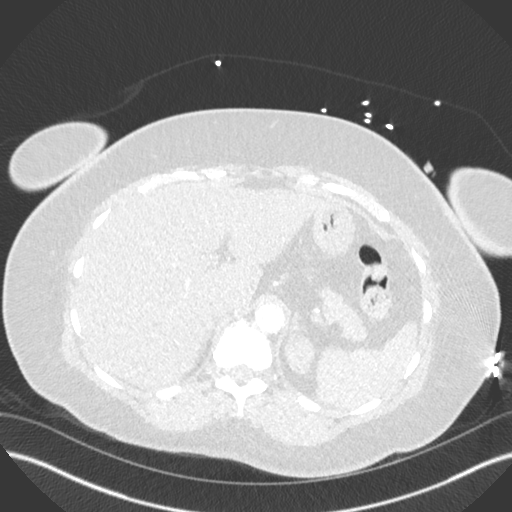
[im 41/368  soft-tissue]
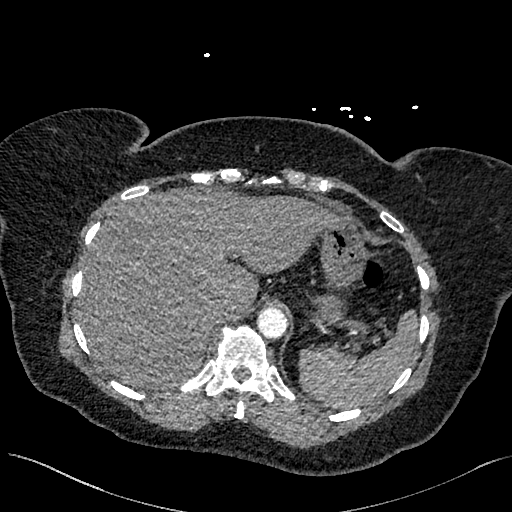
[im 62/368  lung]
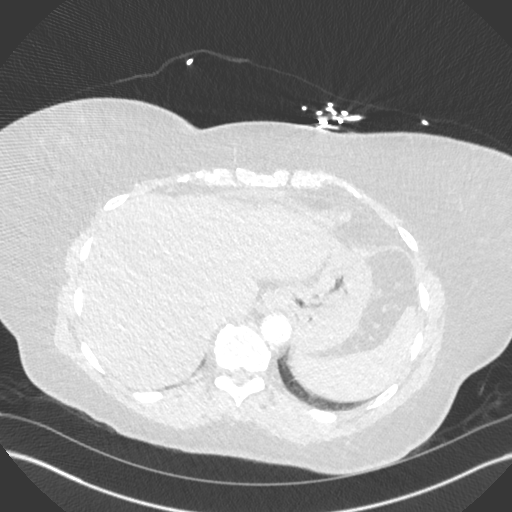
[im 82/368  soft-tissue]
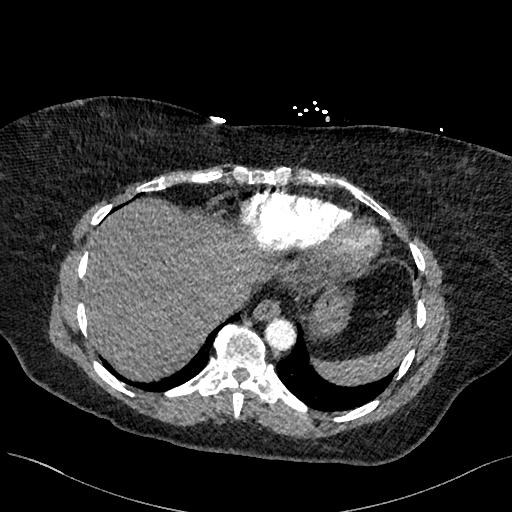
[im 123/368  lung]
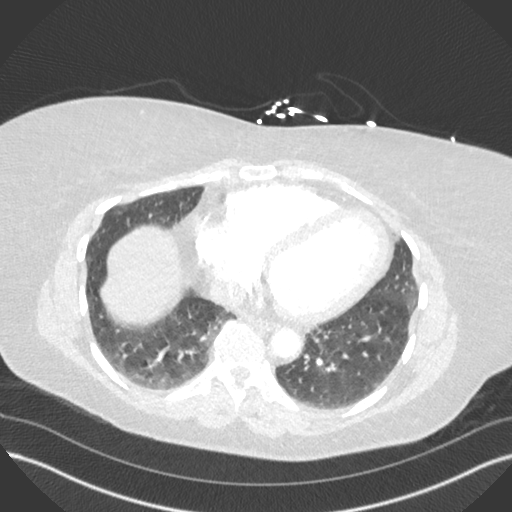
[im 143/368  soft-tissue]
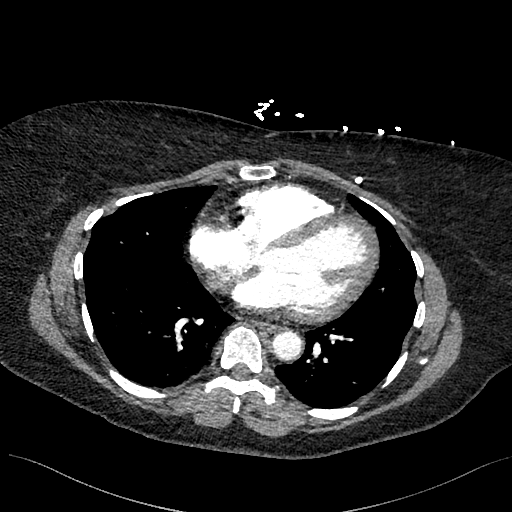
[im 164/368  lung]
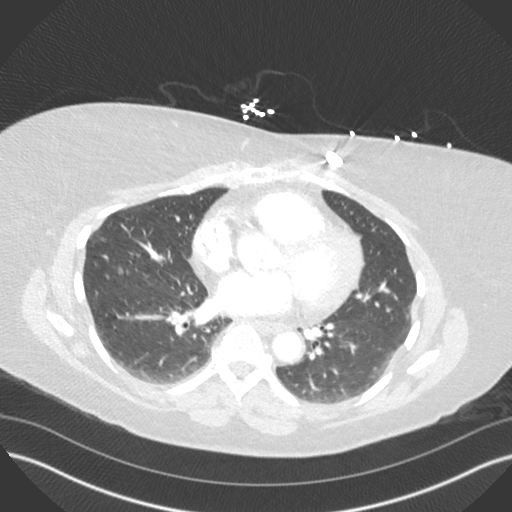
[im 184/368  soft-tissue]
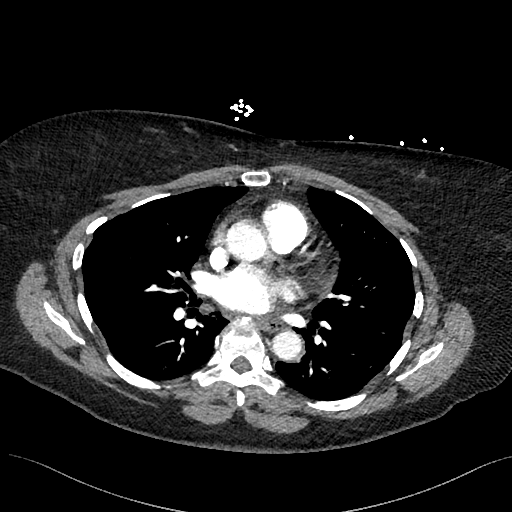
[im 204/368  lung]
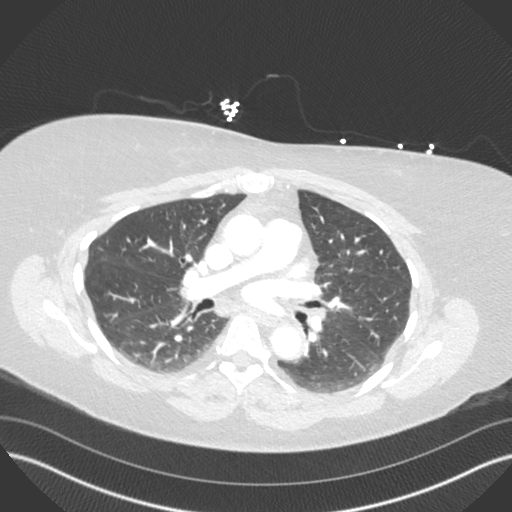
[im 225/368  soft-tissue]
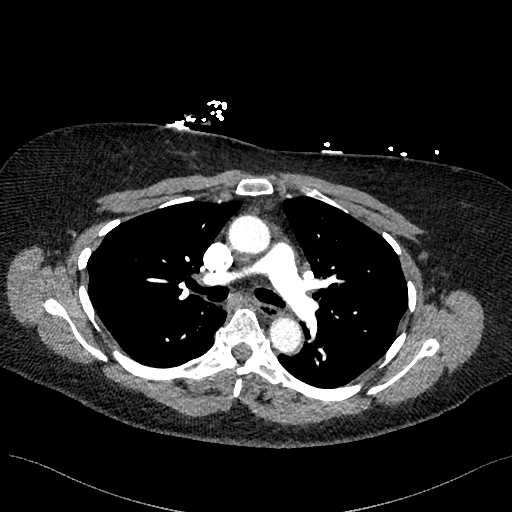
[im 245/368  lung]
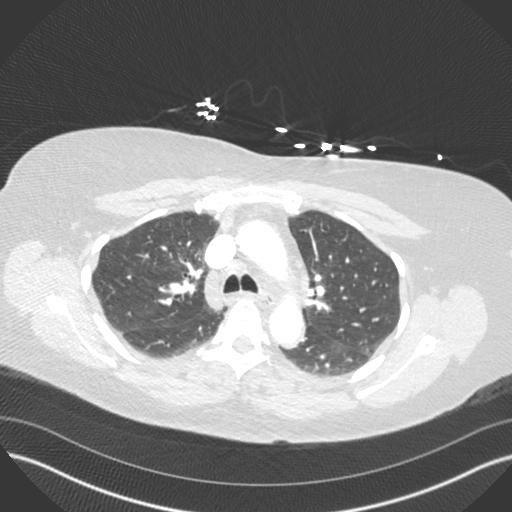
[im 286/368  soft-tissue]
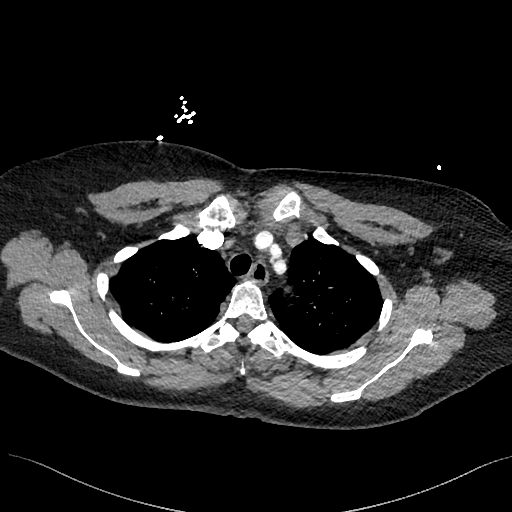
[im 306/368  lung]
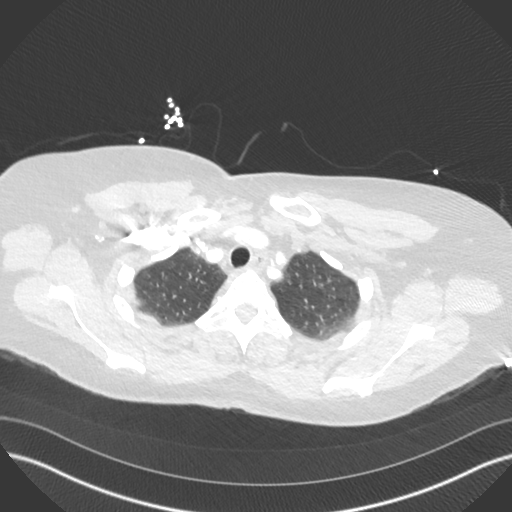
[im 327/368  soft-tissue]
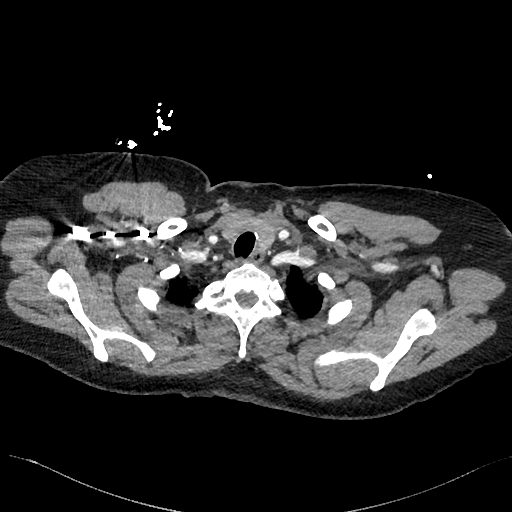
[im 347/368  lung]
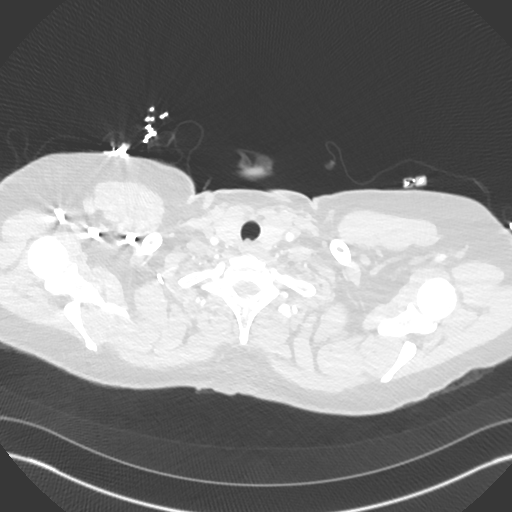

[Series 8: cor · coronal · 0.52mm/px · 3 of 142 slices shown]
[im 36/142  soft-tissue]
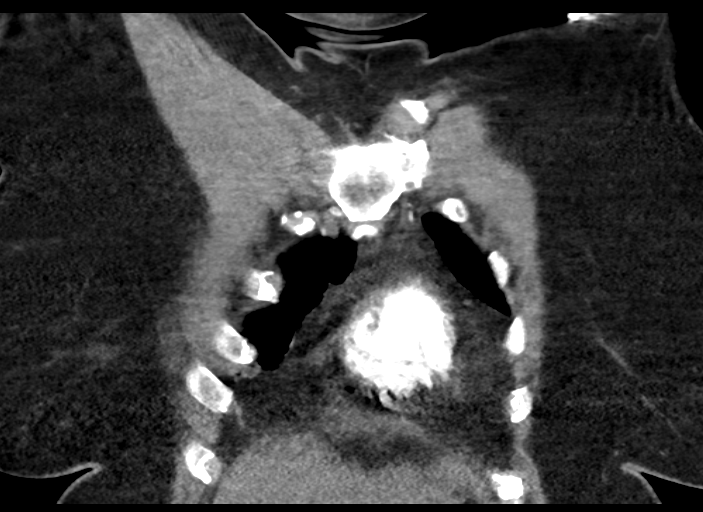
[im 71/142  soft-tissue]
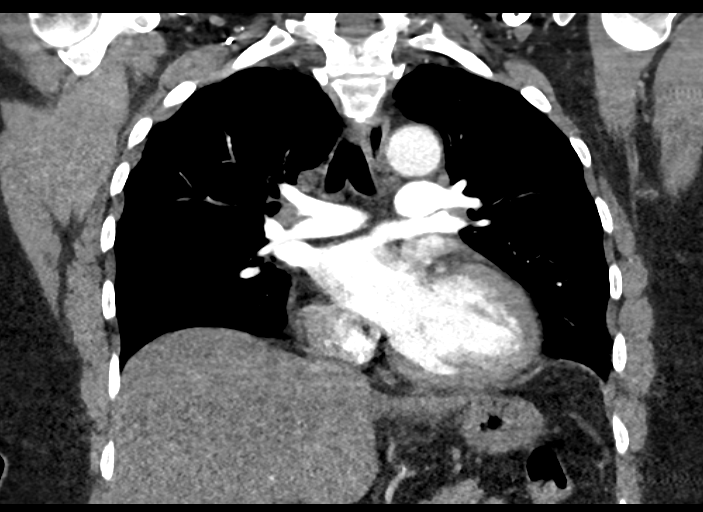
[im 106/142  soft-tissue]
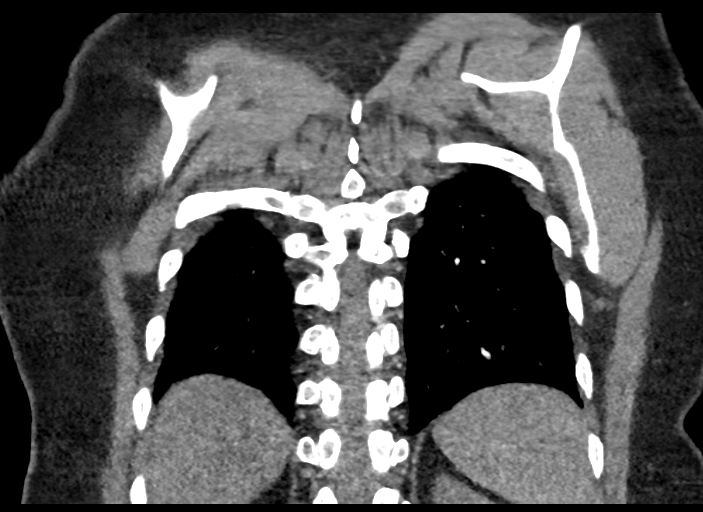

[18 of 46 positions shown; findings below may reference images not displayed]

FINDINGS: Cardiovascular: There are no filling defects in the pulmonary
arterial tree to suggest acute pulmonary thromboembolism. Maximal
diameter of the ascending aorta is 3.0 cm. There is no evidence of
aortic dissection. There is no obvious acute intramural hematoma.
Great vessels are patent within the confines of the study. Vertebral
arteries are also patent within the confines of the examination.
Direct takeoff of the left vertebral artery from the arch.

Mediastinum/Nodes: No abnormal mediastinal adenopathy. No
pericardial effusion. Esophagus is unremarkable. Thyroid is within
normal limits.

Lungs/Pleura: Lungs are under aerated with patchy hypoaeration but
no definitive consolidation. No pneumothorax or pleural effusion.

Upper Abdomen: No acute abnormality.

Musculoskeletal: There is no vertebral compression deformity.

Review of the MIP images confirms the above findings.
IMPRESSION: No evidence of acute pulmonary thromboembolism.

## 2019-05-22 ENCOUNTER — Ambulatory Visit (INDEPENDENT_AMBULATORY_CARE_PROVIDER_SITE_OTHER): Payer: BC Managed Care – PPO | Admitting: *Deleted

## 2019-05-22 DIAGNOSIS — I480 Paroxysmal atrial fibrillation: Secondary | ICD-10-CM | POA: Diagnosis not present

## 2019-05-22 LAB — CUP PACEART REMOTE DEVICE CHECK
Date Time Interrogation Session: 20210228234512
Implantable Pulse Generator Implant Date: 20180906

## 2019-05-23 NOTE — Progress Notes (Signed)
ILR Remote 

## 2019-06-15 DIAGNOSIS — Z1152 Encounter for screening for COVID-19: Secondary | ICD-10-CM | POA: Diagnosis not present

## 2019-06-22 ENCOUNTER — Ambulatory Visit (INDEPENDENT_AMBULATORY_CARE_PROVIDER_SITE_OTHER): Payer: BC Managed Care – PPO | Admitting: *Deleted

## 2019-06-22 DIAGNOSIS — I480 Paroxysmal atrial fibrillation: Secondary | ICD-10-CM | POA: Diagnosis not present

## 2019-06-22 LAB — CUP PACEART REMOTE DEVICE CHECK
Date Time Interrogation Session: 20210401004809
Implantable Pulse Generator Implant Date: 20180906

## 2019-06-22 NOTE — Progress Notes (Signed)
ILR Remote 

## 2019-07-24 ENCOUNTER — Ambulatory Visit (INDEPENDENT_AMBULATORY_CARE_PROVIDER_SITE_OTHER): Payer: BC Managed Care – PPO | Admitting: *Deleted

## 2019-07-24 DIAGNOSIS — I480 Paroxysmal atrial fibrillation: Secondary | ICD-10-CM

## 2019-07-24 LAB — CUP PACEART REMOTE DEVICE CHECK
Date Time Interrogation Session: 20210502005129
Implantable Pulse Generator Implant Date: 20180906

## 2019-07-25 NOTE — Progress Notes (Signed)
Carelink Summary Report / Loop Recorder 

## 2019-08-24 ENCOUNTER — Ambulatory Visit (INDEPENDENT_AMBULATORY_CARE_PROVIDER_SITE_OTHER): Payer: BC Managed Care – PPO | Admitting: *Deleted

## 2019-08-24 DIAGNOSIS — I480 Paroxysmal atrial fibrillation: Secondary | ICD-10-CM | POA: Diagnosis not present

## 2019-08-24 LAB — CUP PACEART REMOTE DEVICE CHECK
Date Time Interrogation Session: 20210602230325
Implantable Pulse Generator Implant Date: 20180906

## 2019-08-29 NOTE — Progress Notes (Signed)
Carelink Summary Report / Loop Recorder 

## 2019-09-26 ENCOUNTER — Ambulatory Visit (INDEPENDENT_AMBULATORY_CARE_PROVIDER_SITE_OTHER): Payer: BC Managed Care – PPO | Admitting: *Deleted

## 2019-09-26 DIAGNOSIS — I480 Paroxysmal atrial fibrillation: Secondary | ICD-10-CM

## 2019-09-26 LAB — CUP PACEART REMOTE DEVICE CHECK
Date Time Interrogation Session: 20210706021844
Implantable Pulse Generator Implant Date: 20180906

## 2019-09-28 NOTE — Progress Notes (Signed)
Carelink Summary Report / Loop Recorder 

## 2019-10-26 ENCOUNTER — Ambulatory Visit (INDEPENDENT_AMBULATORY_CARE_PROVIDER_SITE_OTHER): Payer: BC Managed Care – PPO | Admitting: *Deleted

## 2019-10-26 DIAGNOSIS — I480 Paroxysmal atrial fibrillation: Secondary | ICD-10-CM

## 2019-10-30 LAB — CUP PACEART REMOTE DEVICE CHECK
Date Time Interrogation Session: 20210808021854
Implantable Pulse Generator Implant Date: 20180906

## 2019-10-30 NOTE — Progress Notes (Signed)
Carelink Summary Report / Loop Recorder 

## 2019-11-06 ENCOUNTER — Other Ambulatory Visit: Payer: Self-pay | Admitting: Cardiovascular Disease

## 2019-11-28 ENCOUNTER — Ambulatory Visit (INDEPENDENT_AMBULATORY_CARE_PROVIDER_SITE_OTHER): Payer: BC Managed Care – PPO | Admitting: *Deleted

## 2019-11-28 DIAGNOSIS — I480 Paroxysmal atrial fibrillation: Secondary | ICD-10-CM

## 2019-12-01 LAB — CUP PACEART REMOTE DEVICE CHECK
Date Time Interrogation Session: 20210910022202
Implantable Pulse Generator Implant Date: 20180906

## 2019-12-01 NOTE — Progress Notes (Signed)
Carelink Summary Report / Loop Recorder 

## 2019-12-19 DIAGNOSIS — Z1231 Encounter for screening mammogram for malignant neoplasm of breast: Secondary | ICD-10-CM | POA: Diagnosis not present

## 2019-12-25 ENCOUNTER — Other Ambulatory Visit: Payer: Self-pay | Admitting: Cardiovascular Disease

## 2019-12-28 ENCOUNTER — Ambulatory Visit: Payer: BC Managed Care – PPO

## 2020-01-03 LAB — CUP PACEART REMOTE DEVICE CHECK
Date Time Interrogation Session: 20211013022232
Implantable Pulse Generator Implant Date: 20180906

## 2020-01-05 ENCOUNTER — Other Ambulatory Visit: Payer: Self-pay | Admitting: Cardiovascular Disease

## 2020-01-08 MED ORDER — CARVEDILOL 3.125 MG PO TABS: 3.1250 mg | ORAL_TABLET | Freq: Two times a day (BID) | ORAL | 3 refills | Status: DC

## 2020-01-24 ENCOUNTER — Telehealth: Payer: Self-pay

## 2020-01-24 NOTE — Telephone Encounter (Signed)
ILR tachy alert received for irregular R-R w/ rates 180's, duration 29 seconds.   Called patient to assess s/s, medication compliance including Coreg 3.125 mg BID, Lopressor 25 mg prn for blood pressure, xarelto 20 mg daily.  No answer, LMOVM.

## 2020-01-29 ENCOUNTER — Ambulatory Visit (INDEPENDENT_AMBULATORY_CARE_PROVIDER_SITE_OTHER): Payer: BC Managed Care – PPO

## 2020-01-29 DIAGNOSIS — I480 Paroxysmal atrial fibrillation: Secondary | ICD-10-CM

## 2020-01-30 LAB — CUP PACEART REMOTE DEVICE CHECK
Date Time Interrogation Session: 20211107230932
Implantable Pulse Generator Implant Date: 20180906

## 2020-01-30 NOTE — Telephone Encounter (Signed)
LMOM to call DC , # and office hours provided. 

## 2020-01-31 NOTE — Progress Notes (Signed)
Carelink Summary Report / Loop Recorder 

## 2020-02-01 NOTE — Telephone Encounter (Signed)
Called patient to follow up. Patient reports she does not recall feeling any symptoms. Reports compliance with medications. Patient aware to call with any symptoms or concerns. Advised I will forward to Dr. Royann Shivers with any changes, verbalized understanding.

## 2020-02-01 NOTE — Telephone Encounter (Signed)
Thanks. No change in therapy planned.

## 2020-02-19 ENCOUNTER — Telehealth: Payer: Self-pay

## 2020-02-19 NOTE — Telephone Encounter (Signed)
Patient called in wanting to know how much it will cost her to get a ILR replaced and she wants to know the CPT code so she can call her insurance

## 2020-02-26 ENCOUNTER — Telehealth: Payer: Self-pay | Admitting: *Deleted

## 2020-02-26 NOTE — Telephone Encounter (Signed)
Spoke with the patient. She had come in last week to get information about having her loop explanted and another implanted. She wanted to know how much this would cost.  She had been advised that she can call [employer] to get an estimate but asked that this be done by this office. We have attempted to call several times to [facility] but have not been able to speak to anyone.   The patient stated that she needed the estimate to be in writing. She has once again been advised that she should call [employer] or go to their website but stated that this needed to be done by this office.   A staff message has been sent to pre cert to see if they can help.

## 2020-03-04 ENCOUNTER — Emergency Department (HOSPITAL_BASED_OUTPATIENT_CLINIC_OR_DEPARTMENT_OTHER): Payer: BC Managed Care – PPO

## 2020-03-04 ENCOUNTER — Other Ambulatory Visit: Payer: Self-pay

## 2020-03-04 ENCOUNTER — Ambulatory Visit (INDEPENDENT_AMBULATORY_CARE_PROVIDER_SITE_OTHER): Payer: BC Managed Care – PPO

## 2020-03-04 ENCOUNTER — Emergency Department (HOSPITAL_BASED_OUTPATIENT_CLINIC_OR_DEPARTMENT_OTHER)
Admission: EM | Admit: 2020-03-04 | Discharge: 2020-03-04 | Disposition: A | Discharge status: Home / Self Care | Payer: BC Managed Care – PPO | Attending: Emergency Medicine | Admitting: Emergency Medicine

## 2020-03-04 ENCOUNTER — Telehealth: Payer: Self-pay | Admitting: Cardiovascular Disease

## 2020-03-04 ENCOUNTER — Encounter (HOSPITAL_BASED_OUTPATIENT_CLINIC_OR_DEPARTMENT_OTHER): Payer: Self-pay

## 2020-03-04 DIAGNOSIS — I10 Essential (primary) hypertension: Secondary | ICD-10-CM | POA: Diagnosis not present

## 2020-03-04 DIAGNOSIS — Z7901 Long term (current) use of anticoagulants: Secondary | ICD-10-CM | POA: Diagnosis not present

## 2020-03-04 DIAGNOSIS — Z79899 Other long term (current) drug therapy: Secondary | ICD-10-CM | POA: Insufficient documentation

## 2020-03-04 DIAGNOSIS — R079 Chest pain, unspecified: Secondary | ICD-10-CM | POA: Diagnosis not present

## 2020-03-04 DIAGNOSIS — I4891 Unspecified atrial fibrillation: Secondary | ICD-10-CM | POA: Diagnosis not present

## 2020-03-04 DIAGNOSIS — R0789 Other chest pain: Secondary | ICD-10-CM

## 2020-03-04 DIAGNOSIS — I480 Paroxysmal atrial fibrillation: Secondary | ICD-10-CM

## 2020-03-04 LAB — BASIC METABOLIC PANEL
Anion gap: 7 (ref 5–15)
BUN: 19 mg/dL (ref 6–20)
CO2: 30 mmol/L (ref 22–32)
Calcium: 8.5 mg/dL — ABNORMAL LOW (ref 8.9–10.3)
Chloride: 100 mmol/L (ref 98–111)
Creatinine, Ser: 0.88 mg/dL (ref 0.44–1.00)
GFR, Estimated: >60 mL/min (ref >60)
Glucose, Bld: 97 mg/dL (ref 70–99)
Potassium: 3.6 mmol/L (ref 3.5–5.1)
Sodium: 137 mmol/L (ref 135–145)

## 2020-03-04 LAB — CBC
HCT: 35.9 % — ABNORMAL LOW (ref 36.0–46.0)
Hemoglobin: 11.5 g/dL — ABNORMAL LOW (ref 12.0–15.0)
MCH: 28.8 pg (ref 26.0–34.0)
MCHC: 32 g/dL (ref 30.0–36.0)
MCV: 90 fL (ref 80.0–100.0)
Platelets: 299 10*3/uL (ref 150–400)
RBC: 3.99 MIL/uL (ref 3.87–5.11)
RDW: 13.2 % (ref 11.5–15.5)
WBC: 8 10*3/uL (ref 4.0–10.5)
nRBC: 0 % (ref 0.0–0.2)

## 2020-03-04 LAB — CUP PACEART REMOTE DEVICE CHECK
Date Time Interrogation Session: 20211210230908
Implantable Pulse Generator Implant Date: 20180906

## 2020-03-04 LAB — TROPONIN I (HIGH SENSITIVITY)
Troponin I (High Sensitivity): 4 ng/L (ref <18)
Troponin I (High Sensitivity): 3 ng/L (ref <18)

## 2020-03-04 NOTE — ED Notes (Signed)
Patient denies pain and is resting comfortably.  

## 2020-03-04 NOTE — Telephone Encounter (Signed)
Received call from patient she stated she has had afib most of weekend.Stated she has been having chest tightness since last night.She rates tightness at present # 6.She does not know if she is still in afib. Advised she needs to go to [facility] ED.Trish notified.

## 2020-03-04 NOTE — Discharge Instructions (Addendum)
Schedule to see your cardiologist for evaluation.  Go to [facility] hospital is any problems.

## 2020-03-04 NOTE — ED Triage Notes (Signed)
Pt reports chest pressure since Saturday along with going in and out of Afib. Pt states since today she has had an intermittent sharp pain in the center of her chest. Pt was sent by cardiology. Pt states some shortness breath but denies n/v.

## 2020-03-04 NOTE — Telephone Encounter (Signed)
Pt c/o of Chest Pain: STAT if CP now or developed within 24 hours  1. Are you having CP right now? Yes   2. Are you experiencing any other symptoms (ex. SOB, nausea, vomiting, sweating)? Excessive Fatigue   3. How long have you been experiencing CP? Since Saturday 03/02/20  4. Is your CP continuous or coming and going? Continuous   5. Have you taken Nitroglycerin? No  Tina Robinson states it's more so tightness than pain. Had an episode of Afib this Saturday 03/02/20. She is requesting to be seen today in regards to this. Please advise.  ?

## 2020-03-05 ENCOUNTER — Telehealth: Payer: Self-pay

## 2020-03-05 NOTE — Telephone Encounter (Signed)
-----   Message from Thurmon Fair, MD sent at 03/04/2020  7:36 PM EST ----- Implantable loop recorder download. No new clinically significant events. atrial fibrillation burden remains low (overall 0.8%), with episodes occurring once or twice a week, lasting usually less than 20 minutes. Normal battery status.  This transmission covers the period through 02/29/2020 early AM.   She was seen in [facility] for palpitations and chest pain on 12/13. Was there a [facility] download? If not can we please ask her to do a download and focus on events on 12/13, please.

## 2020-03-05 NOTE — ED Provider Notes (Addendum)
[facility] EMERGENCY DEPARTMENT Provider Note   CSN: [id number] Arrival date & time: 03/04/20  6384     History Chief Complaint  Patient presents with  . Chest Pain    Tina Robinson is a 54 y.o. female.  The history is provided by the patient. No language interpreter was used.  Chest Pain Pain location:  L chest Pain quality: aching   Pain radiates to:  Does not radiate Pain severity:  Moderate Onset quality:  Gradual Timing:  Constant Progression:  Worsening Chronicity:  New Relieved by:  Nothing Worsened by:  Nothing Ineffective treatments:  None tried Associated symptoms: nausea   Pt reports she has atrial fibrillation.  Pt has a loop recorder and she felt like she was in atrial fibrillation     Past Medical History:  Diagnosis Date  . Atrial flutter, paroxysmal (HCC) 11/04/2016  . Benign essential HTN   . Murmur, cardiac     Patient Active Problem List   Diagnosis Date Noted  . Severe obesity (BMI 35.0-35.9 with comorbidity) (HCC) 10/07/2018  . Obesity (BMI 30-39.9) 08/26/2017  . History of loop recorder 08/02/2017  . Essential hypertension 08/02/2017  . Edema of both feet 08/02/2017  . Shortness of breath 11/12/2016  . Right-sided chest pain 11/12/2016  . Chest pain, rule out acute myocardial infarction   . PAF (paroxysmal atrial fibrillation) (HCC) 11/04/2016  . Leg cramping 11/04/2016    Past Surgical History:  Procedure Laterality Date  . CESAREAN SECTION    . LOOP RECORDER INSERTION N/A 11/26/2016   Procedure: LOOP RECORDER INSERTION;  Surgeon: Thurmon Fair, MD;  Location: [facility] INVASIVE CV LAB;  Service: Cardiovascular;  Laterality: N/A;     OB History   No obstetric history on file.     Family History  Problem Relation Age of Onset  . Peripheral Artery Disease Mother        amputee  . Hypertension Mother   . Hypertension Father   . Stroke Father 39    Social History   Tobacco Use  . Smoking status: Never Smoker  .  Smokeless tobacco: Never Used  Substance Use Topics  . Alcohol use: No  . Drug use: No    Home Medications Prior to Admission medications   Medication Sig Start Date End Date Taking? Authorizing Provider  carvedilol (COREG) 3.125 MG tablet Take 1 tablet (3.125 mg total) by mouth 2 (two) times daily. 01/08/20   Croitoru, Mihai, MD  hydrochlorothiazide (MICROZIDE) 12.5 MG capsule TAKE 1 CAPSULE BY MOUTH EVERY DAY 11/06/19   Croitoru, Rachelle Hora, MD  meclizine (ANTIVERT) 25 MG tablet Take 1 tablet (25 mg total) by mouth 3 (three) times daily as needed for dizziness. 10/10/18   Dione Booze, MD  metoprolol tartrate (LOPRESSOR) 25 MG tablet TAKE 1 TABLET (25 MG TOTAL) BY MOUTH DAILY AS NEEDED. Patient taking differently: Take 25 mg by mouth as needed (blood pressure).  06/02/17 12/05/18  Croitoru, Mihai, MD  XARELTO 20 MG TABS tablet TAKE 1 TABLET (20 MG TOTAL) BY MOUTH DAILY WITH SUPPER. 03/23/19   Croitoru, Mihai, MD  diltiazem (CARDIZEM CD) 180 MG 24 hr capsule TAKE ONE CAPSULE EVERY DAY Patient not taking: No sig reported 11/04/17 10/10/18  Croitoru, Mihai, MD    Allergies    Mushroom extract complex  Review of Systems   Review of Systems  Cardiovascular: Positive for chest pain.  Gastrointestinal: Positive for nausea.  All other systems reviewed and are negative.   Physical Exam Updated Vital Signs  BP (!) 143/81 (BP Location: Right Arm)   Pulse 71   Temp 98.4 F (36.9 C) (Oral)   Resp (!) 21   Ht 5' 7.5" (1.715 m)   Wt 101.6 kg   LMP 01/12/2012   SpO2 100%   BMI 34.57 kg/m   Physical Exam Vitals and nursing note reviewed.  Constitutional:      Appearance: She is well-developed and well-nourished.  HENT:     Head: Normocephalic.  Eyes:     Extraocular Movements: EOM normal.  Cardiovascular:     Rate and Rhythm: Normal rate and regular rhythm.     Heart sounds: Normal heart sounds.  Pulmonary:     Effort: Pulmonary effort is normal.     Breath sounds: Normal breath sounds.   Abdominal:     General: There is no distension.     Palpations: Abdomen is soft.  Musculoskeletal:        General: Normal range of motion.     Cervical back: Normal range of motion.  Skin:    General: Skin is warm.  Neurological:     General: No focal deficit present.     Mental Status: She is alert and oriented to person, place, and time.  Psychiatric:        Mood and Affect: Mood and affect and mood normal.     ED Results / Procedures / Treatments   Labs (all labs ordered are listed, but only abnormal results are displayed) Labs Reviewed  BASIC METABOLIC PANEL - Abnormal; Notable for the following components:      Result Value   Calcium 8.5 (*)    All other components within normal limits  CBC - Abnormal; Notable for the following components:   Hemoglobin 11.5 (*)    HCT 35.9 (*)    All other components within normal limits  TROPONIN I (HIGH SENSITIVITY)  TROPONIN I (HIGH SENSITIVITY)    EKG EKG Interpretation  Date/Time:  Monday March 04 2020 09:56:08 EST Ventricular Rate:  79 PR Interval:  142 QRS Duration: 100 QT Interval:  408 QTC Calculation: 467 R Axis:   33 Text Interpretation: Normal sinus rhythm Cannot rule out Anterior infarct , age undetermined Abnormal ECG No significant change since last tracing Confirmed by Melene Plan [phone]) on 03/04/2020 10:32:21 AM   Radiology DG Chest 2 View  Result Date: 03/04/2020 CLINICAL DATA:  Chest pain EXAM: CHEST - 2 VIEW COMPARISON:  October 09, 2018 FINDINGS: No edema or airspace opacity. Heart is upper normal in size with pulmonary vascularity normal. Loop recorder on the left anteriorly. No adenopathy. There is degenerative change in the midthoracic region. IMPRESSION: Lungs clear.  Heart upper normal in size.  Loop recorder on left. Electronically Signed   By: Bretta Bang III M.D.   On: 03/04/2020 10:22    Procedures Procedures (including critical care time)  Medications Ordered in ED Medications - No  data to display  ED Course  I have reviewed the triage vital signs and the nursing notes.  Pertinent labs & imaging results that were available during my care of the patient were reviewed by me and considered in my medical decision making (see chart for details).    MDM Rules/Calculators/A&P                          MDM:  EKG is normal chest xray is normal  Troponin is negative x 2.  I spoke to Dr. Scharlene Gloss Cardiology.  He advised he will have office call for follow up.  They will review Loop recorder.  Final Clinical Impression(s) / ED Diagnoses Final diagnoses:  Atypical chest pain  Atrial fibrillation, unspecified type (HCC)    Rx / DC Orders ED Discharge Orders    None    An After Visit Summary was printed and given to the patient.    Elson Areas, PA-C 03/05/20 1413    Elson Areas, [place] 03/05/20 1413    Melene Plan, DO 03/05/20 1457

## 2020-03-05 NOTE — Telephone Encounter (Signed)
Attempted to reach patient to request manual transmission.  Last data received 03/03/20 0450, device in LINQ 1.  IF CLE report was done it is not in [facility] yet.   Left VM for pt with instructions for sending transmission and Device clinic # to callback if assistance needed.

## 2020-03-08 ENCOUNTER — Ambulatory Visit: Payer: BC Managed Care – PPO | Attending: Internal Medicine

## 2020-03-08 DIAGNOSIS — Z23 Encounter for immunization: Secondary | ICD-10-CM

## 2020-03-08 NOTE — Progress Notes (Signed)
   Covid-19 Vaccination Clinic  Name:  Tina Robinson    MRN: [id number] DOB: 22-Mar-1966  03/08/2020  Tina Robinson was observed post Covid-19 immunization for 15 minutes without incident. She was provided with Vaccine Information Sheet and instruction to access the V-Safe system.   Tina Robinson was instructed to call 911 with any severe reactions post vaccine: Marland Kitchen Difficulty breathing  . Swelling of face and throat  . A fast heartbeat  . A bad rash all over body  . Dizziness and weakness   Immunizations Administered    Name Date Dose VIS Date Route   Pfizer COVID-19 Vaccine 03/08/2020  3:47 PM 0.3 mL 01/10/2020 Intramuscular   Manufacturer: [employer], [employer]   Lot: [id number]   NDC: [address]-1

## 2020-03-11 NOTE — Telephone Encounter (Signed)
Patient will send remote transmission when she gets home from work.

## 2020-03-13 NOTE — Telephone Encounter (Signed)
Manual transmission received. AT/AF burden 2.4%, + xarelto. 3 AF events recorded, appear AF w/ RVR, max VR 162 bpm, longest in duration 6 minutes. No events recorded on 03/04/20.

## 2020-03-13 NOTE — Telephone Encounter (Signed)
Left a message for the patient to call back to schedule a follow up with Dr. Royann Shivers.

## 2020-03-13 NOTE — Telephone Encounter (Signed)
Thank you :)

## 2020-03-19 NOTE — Progress Notes (Signed)
Carelink Summary Report / Loop Recorder 

## 2020-03-21 NOTE — Telephone Encounter (Signed)
Left a message for the patient to call back.  

## 2020-03-25 DIAGNOSIS — J069 Acute upper respiratory infection, unspecified: Secondary | ICD-10-CM | POA: Diagnosis not present

## 2020-03-26 ENCOUNTER — Other Ambulatory Visit: Payer: Self-pay | Admitting: Cardiovascular Disease

## 2020-03-26 NOTE — Telephone Encounter (Signed)
Patient made an appointment with Dr. Royann Shivers on 06/20/2020

## 2020-03-26 NOTE — Telephone Encounter (Signed)
Prescription refill request for Xarelto received.  Indication:atrial fibrillation Last office visit: 1/21  croitoru Weight:101.6 kg Age:55 Scr:0.88  CrCl:114.49 ml/min  Prescription refilled

## 2020-04-08 ENCOUNTER — Telehealth: Payer: Self-pay | Admitting: Student

## 2020-04-08 ENCOUNTER — Ambulatory Visit (INDEPENDENT_AMBULATORY_CARE_PROVIDER_SITE_OTHER): Payer: BC Managed Care – PPO

## 2020-04-08 DIAGNOSIS — I480 Paroxysmal atrial fibrillation: Secondary | ICD-10-CM | POA: Diagnosis not present

## 2020-04-08 LAB — CUP PACEART REMOTE DEVICE CHECK
Date Time Interrogation Session: 20220112234905
Implantable Pulse Generator Implant Date: 20180906

## 2020-04-08 NOTE — Telephone Encounter (Signed)
We have an office visit scheduled in March. It may still provide some info until then "running on fumes". Will discuss replacement versus removal at that time.

## 2020-04-08 NOTE — Telephone Encounter (Signed)
Alert that ILR met RRT 04/04/2020.  Pt alerted. It has been used lately to manage her AF.   I will forward to Dr. Sallyanne Kuster.    She is willing to have it replaced if felt necessary to manager her AF. She would prefer to have it removed if it is not going to be used.   Tina Robinson [address]" [place], PA-C  04/08/2020 10:05 AM

## 2020-04-23 NOTE — Progress Notes (Signed)
Carelink Summary Report / Loop Recorder 

## 2020-04-23 NOTE — Telephone Encounter (Signed)
The patient has been called and verbalized her understanding.

## 2020-05-08 LAB — CUP PACEART REMOTE DEVICE CHECK
Date Time Interrogation Session: 20220214235144
Implantable Pulse Generator Implant Date: 20180906

## 2020-05-13 ENCOUNTER — Telehealth: Payer: Self-pay

## 2020-05-13 NOTE — Telephone Encounter (Signed)
-----   Message from Thurmon Fair, MD sent at 05/10/2020  6:34 PM EST ----- Normal implantable loop recorder download. No new clinically significant events. Battery at RRT - please discontinue regular downloads and schedule appointment to discuss device replacement/removal.

## 2020-05-13 NOTE — Telephone Encounter (Signed)
Spoke with pt, advised device at RRT, Per MD she may DC monitoring.  [facility] updated and return kit ordered.  Paceart updated and future check cancelled.  Pt has appt with Dr. Sallyanne Kuster on 3/30 to discuss next steps.

## 2020-06-20 ENCOUNTER — Ambulatory Visit (INDEPENDENT_AMBULATORY_CARE_PROVIDER_SITE_OTHER): Payer: BC Managed Care – PPO | Admitting: Cardiovascular Disease

## 2020-06-20 ENCOUNTER — Encounter: Payer: Self-pay | Admitting: Cardiovascular Disease

## 2020-06-20 ENCOUNTER — Other Ambulatory Visit: Payer: Self-pay

## 2020-06-20 VITALS — BP 122/78 | HR 89 | Ht 67.5 in | Wt 233.6 lb

## 2020-06-20 DIAGNOSIS — I480 Paroxysmal atrial fibrillation: Secondary | ICD-10-CM

## 2020-06-20 DIAGNOSIS — Z4509 Encounter for adjustment and management of other cardiac device: Secondary | ICD-10-CM

## 2020-06-20 DIAGNOSIS — I10 Essential (primary) hypertension: Secondary | ICD-10-CM | POA: Diagnosis not present

## 2020-06-20 DIAGNOSIS — Z7901 Long term (current) use of anticoagulants: Secondary | ICD-10-CM | POA: Diagnosis not present

## 2020-06-20 DIAGNOSIS — Z6835 Body mass index (BMI) 35.0-35.9, adult: Secondary | ICD-10-CM

## 2020-06-20 MED ORDER — CARVEDILOL 6.25 MG PO TABS: 6.2500 mg | ORAL_TABLET | Freq: Two times a day (BID) | ORAL | 3 refills | Status: DC

## 2020-06-20 NOTE — Progress Notes (Signed)
Cardiology Office Note:    Date:  06/20/2020   ID:  Tina Robinson, DOB Dec 18, 1965, MRN [id number]  PCP:  Patient, No Pcp Per (Inactive)  Cardiologist:  Thurmon Fair, MD    Referring MD: No ref. provider found   No chief complaint on file. Follow-up atrial fibrillation  History of Present Illness:    Tina Robinson is a 55 y.o. female with a hx of paroxysmal atypical atrial flutter and atrial fibrillation, status post implantable loop recorder, returning for follow-up.  She has not had any serious cardiovascular complaints.  She denies shortness of breath of angina at rest or with activity.  She does have palpitations off and on, sometimes they wake her up at night, but they are not associated with dizziness, syncope, angina or shortness of breath.  She denies leg edema, orthopnea, PND.  She has not had any falls, injuries or serious bleeding problems and is compliant with her Xarelto.  Her implantable loop recorder has reached the end of battery service, but we were able to download it today.  She continues to have frequent episodes of atrial fibrillation.  Most days, she will have a few events that last for 2-8 minutes.  She has not had a day with more than a few minutes of atrial fibrillation since last December (total of 4 hours in one day) and before that the last episode of significance was about 12 hours a year ago in February.   During episodes of atrial fibrillation rate control is sometimes mediocre with ventricular rates up in the 140s, but these are never sustained for more than a few minutes.   Past Medical History:  Diagnosis Date  . Atrial flutter, paroxysmal (HCC) 11/04/2016  . Benign essential HTN   . Murmur, cardiac     Past Surgical History:  Procedure Laterality Date  . CESAREAN SECTION    . LOOP RECORDER INSERTION N/A 11/26/2016   Procedure: LOOP RECORDER INSERTION;  Surgeon: Thurmon Fair, MD;  Location: [facility] INVASIVE CV LAB;  Service: Cardiovascular;   Laterality: N/A;    Current Medications: Current Meds  Medication Sig  . Cholecalciferol (VITAMIN D) 50 MCG (2000 UT) tablet Take 2,000 Units by mouth daily.  . hydrochlorothiazide (MICROZIDE) 12.5 MG capsule TAKE 1 CAPSULE BY MOUTH EVERY DAY  . Multiple Vitamin (MULTIVITAMIN) tablet Take 1 tablet by mouth daily.  Carlena Hurl 20 MG TABS tablet TAKE 1 TABLET BY MOUTH DAILY WITH SUPPER.  . [DISCONTINUED] carvedilol (COREG) 3.125 MG tablet Take 1 tablet (3.125 mg total) by mouth 2 (two) times daily.     Allergies:   Mushroom extract complex   Social History   Socioeconomic History  . Marital status: Single    Spouse name: Not on file  . Number of children: Not on file  . Years of education: Not on file  . Highest education level: Not on file  Occupational History  . Not on file  Tobacco Use  . Smoking status: Never Smoker  . Smokeless tobacco: Never Used  Substance and Sexual Activity  . Alcohol use: No  . Drug use: No  . Sexual activity: Not on file  Other Topics Concern  . Not on file  Social History Narrative   Lives with and helps care for 2 grandchildren and her son   Social Determinants of Health   Financial Resource Strain: Not on file  Food Insecurity: Not on file  Transportation Needs: Not on file  Physical Activity: Not on file  Stress: Not on  file  Social Connections: Not on file     Family History: The patient's family history includes Hypertension in her father and mother; Peripheral Artery Disease in her mother; Stroke (age of onset: 13) in her father.  ROS:   Please see the history of present illness.    All other systems are reviewed and are negative.   EKGs/Labs/Other Studies Reviewed:    The following studies were reviewed today: Loop recorder download today  EKG:  EKG is ordered today.  It shows normal sinus rhythm with very minor T wave flattening, otherwise normal, QTC 455 ms.  Recent Labs: 03/04/2020: BUN 19; Creatinine, Ser 0.88;  Hemoglobin 11.5; Platelets 299; Potassium 3.6; Sodium 137  Recent Lipid Panel    Component Value Date/Time   CHOL 180 12/07/2017 1134   TRIG 99 12/07/2017 1134   HDL 61 12/07/2017 1134   CHOLHDL 3.0 12/07/2017 1134   CHOLHDL 2.8 11/05/2016 0322   VLDL 13 11/05/2016 0322   LDLCALC 99 12/07/2017 1134    Physical Exam:    VS:  BP 122/78   Pulse 89   Ht 5' 7.5" (1.715 m)   Wt 233 lb 9.6 oz (106 kg)   LMP 01/12/2012   SpO2 97%   BMI 36.05 kg/m     Wt Readings from Last 3 Encounters:  06/20/20 233 lb 9.6 oz (106 kg)  03/04/20 224 lb (101.6 kg)  03/27/19 241 lb (109.3 kg)      General: Alert, oriented x3, no distress, obese Head: no evidence of trauma, PERRL, EOMI, no exophtalmos or lid lag, no myxedema, no xanthelasma; normal ears, nose and oropharynx Neck: normal jugular venous pulsations and no hepatojugular reflux; brisk carotid pulses without delay and no carotid bruits Chest: clear to auscultation, no signs of consolidation by percussion or palpation, normal fremitus, symmetrical and full respiratory excursions Cardiovascular: normal position and quality of the apical impulse, regular rhythm, normal first and second heart sounds, no murmurs, rubs or gallops Abdomen: no tenderness or distention, no masses by palpation, no abnormal pulsatility or arterial bruits, normal bowel sounds, no hepatosplenomegaly Extremities: no clubbing, cyanosis or edema; 2+ radial, ulnar and brachial pulses bilaterally; 2+ right femoral, posterior tibial and dorsalis pedis pulses; 2+ left femoral, posterior tibial and dorsalis pedis pulses; no subclavian or femoral bruits Neurological: grossly nonfocal Psych: Normal mood and affect   ASSESSMENT:    1. Essential hypertension   2. Long term current use of anticoagulant   3. PAF (paroxysmal atrial fibrillation) (HCC)   4. Severe obesity (BMI 35.0-35.9 with comorbidity) (HCC)   5. Encounter for loop recorder at end of battery life    PLAN:     In order of problems listed above:  1. Paroxysmal atrial flutter and fibrillation: Episodes are frequent but very brief and most of them are asymptomatic.  She is appropriately anticoagulated.  CHADSVasc 2 (gender, hypertension).  Rate control remains mediocre, but the overall burden of arrhythmia is very low.  We will try increasing the dose of carvedilol a little bit, but she does not really tolerate higher doses of beta-blockers well.  I think if the symptoms become more troublesome she would be an excellent candidate for ablation.  It is quite possible that she has underlying pulmonary vein tachycardia she does not have much in the way of structural heart disease.  Her left atrium was only mildly dilated with an end-systolic diameter of 42 mm. 2. Anticoagulation: No bleeding problems 3. HTN: Well-controlled.  We previously reduced the  carvedilol dose due to fatigue.  We will try to see if she can have a higher dose for better ventricular rate control. 4. Severe obesity with comorbid conditions: She has lost about 10 pounds, but remains severely obese which will increase the future prevalence of arrhythmia. 5. ILR battery depletion: We were able to interrogate her device today, but its at the end of its battery life.  We discussed whether or not she should have it removed or replaced.  I think we will make decisions regarding treatment changes based more symptoms than the burden of arrhythmia at this point.  Decided to leave the old device in place for the time being and not replace it with a new one.   Medication Adjustments/Labs and Tests Ordered: Current medicines are reviewed at length with the patient today.  Concerns regarding medicines are outlined above.  Orders Placed This Encounter  Procedures  . EKG 12-Lead   Meds ordered this encounter  Medications  . carvedilol (COREG) 6.25 MG tablet    Sig: Take 1 tablet (6.25 mg total) by mouth 2 (two) times daily.    Dispense:  180 tablet     Refill:  3    Signed, Thurmon Fair, MD  06/20/2020 11:19 AM    [facility]

## 2020-06-20 NOTE — Patient Instructions (Signed)
Medication Instructions:  INCREASE the Carvedilol to 6.25 mg twice daily  *If you need a refill on your cardiac medications before your next appointment, please call your pharmacy*   Lab Work: None ordered If you have labs (blood work) drawn today and your tests are completely normal, you will receive your results only by: Marland Kitchen MyChart Message (if you have MyChart) OR . A paper copy in the mail If you have any lab test that is abnormal or we need to change your treatment, we will call you to review the results.   Testing/Procedures: None ordered   Follow-Up: At [facility], you and your health needs are our priority.  As part of our continuing mission to provide you with exceptional heart care, we have created designated Provider Care Teams.  These Care Teams include your primary Cardiologist (physician) and Advanced Practice Providers (APPs -  Physician Assistants and Nurse Practitioners) who all work together to provide you with the care you need, when you need it.  We recommend signing up for the patient portal called "MyChart".  Sign up information is provided on this After Visit Summary.  MyChart is used to connect with patients for Virtual Visits (Telemedicine).  Patients are able to view lab/test results, encounter notes, upcoming appointments, etc.  Non-urgent messages can be sent to your provider as well.   To learn more about what you can do with MyChart, go to [email or web address].    Your next appointment:   12 month(s)  The format for your next appointment:   In Person  Provider:   You may see Thurmon Fair, MD or one of the following Advanced Practice Providers on your designated Care Team:    Azalee Course, PA-C  Micah Flesher, PA-C or   Judy Pimple, [place]

## 2020-08-29 DIAGNOSIS — M7581 Other shoulder lesions, right shoulder: Secondary | ICD-10-CM | POA: Diagnosis not present

## 2020-08-30 DIAGNOSIS — M6281 Muscle weakness (generalized): Secondary | ICD-10-CM | POA: Diagnosis not present

## 2020-08-30 DIAGNOSIS — M25611 Stiffness of right shoulder, not elsewhere classified: Secondary | ICD-10-CM | POA: Diagnosis not present

## 2020-08-30 DIAGNOSIS — M7541 Impingement syndrome of right shoulder: Secondary | ICD-10-CM | POA: Diagnosis not present

## 2020-08-30 DIAGNOSIS — S46011D Strain of muscle(s) and tendon(s) of the rotator cuff of right shoulder, subsequent encounter: Secondary | ICD-10-CM | POA: Diagnosis not present

## 2020-09-13 DIAGNOSIS — M25611 Stiffness of right shoulder, not elsewhere classified: Secondary | ICD-10-CM | POA: Diagnosis not present

## 2020-09-13 DIAGNOSIS — M7541 Impingement syndrome of right shoulder: Secondary | ICD-10-CM | POA: Diagnosis not present

## 2020-09-13 DIAGNOSIS — M6281 Muscle weakness (generalized): Secondary | ICD-10-CM | POA: Diagnosis not present

## 2020-09-13 DIAGNOSIS — S46011D Strain of muscle(s) and tendon(s) of the rotator cuff of right shoulder, subsequent encounter: Secondary | ICD-10-CM | POA: Diagnosis not present

## 2020-12-14 ENCOUNTER — Other Ambulatory Visit: Payer: Self-pay | Admitting: Cardiovascular Disease

## 2021-01-02 DIAGNOSIS — Z1231 Encounter for screening mammogram for malignant neoplasm of breast: Secondary | ICD-10-CM | POA: Diagnosis not present

## 2021-01-05 IMAGING — CR DG CHEST 2V
2 series · 2 of 2 positions shown · non-contrast
Comparison: October 09, 2018

CLINICAL DATA: Chest pain

EXAM:
CHEST - 2 VIEW

[w chest pa]
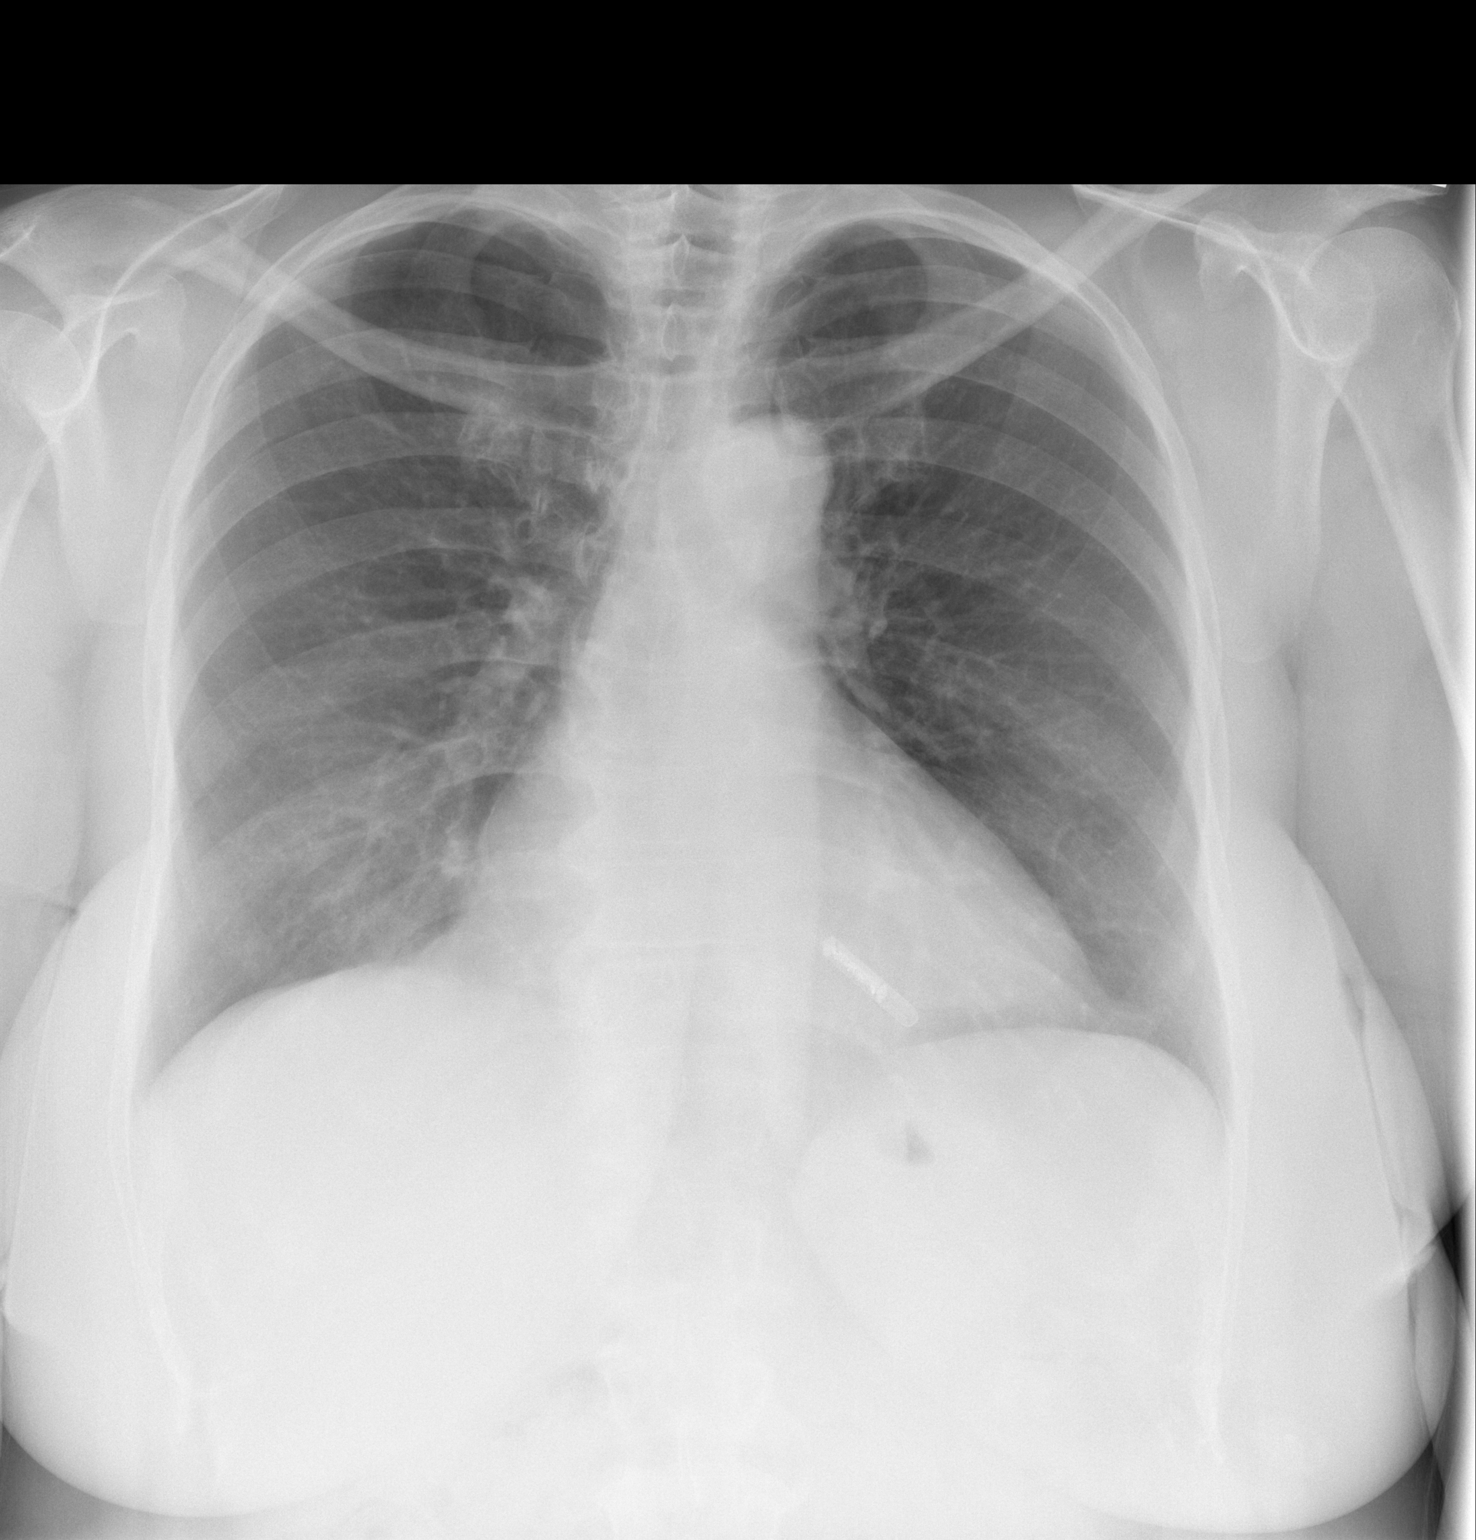

[w chest lat *]
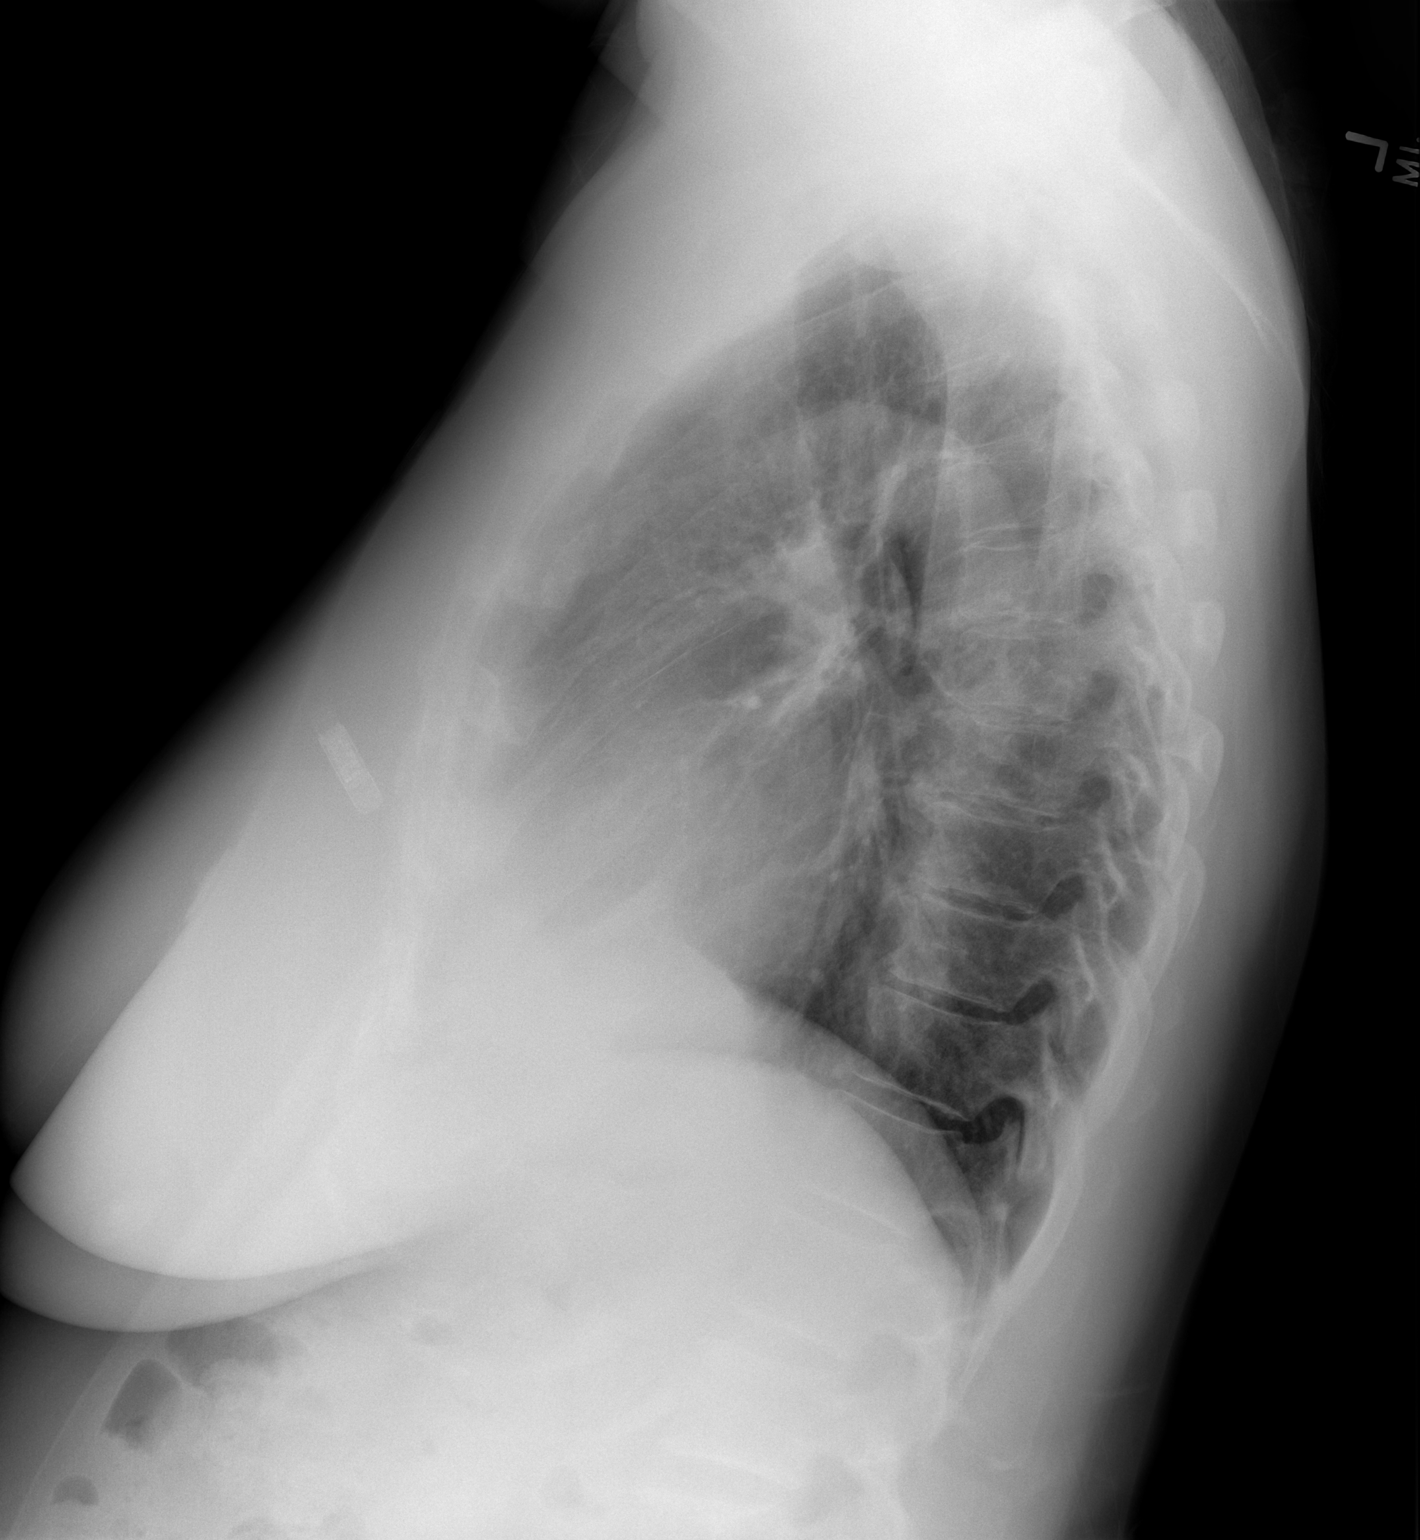

[2 of 2 positions shown; findings below may reference images not displayed]

FINDINGS: No edema or airspace opacity. Heart is upper normal in size with
pulmonary vascularity normal. Loop recorder on the left anteriorly.
No adenopathy. There is degenerative change in the midthoracic
region.
IMPRESSION: Lungs clear.  Heart upper normal in size.  Loop recorder on left.

## 2021-04-08 ENCOUNTER — Telehealth: Payer: Self-pay | Admitting: Cardiovascular Disease

## 2021-04-08 NOTE — Telephone Encounter (Signed)
Spoke with patient who has been having palpitations/heart racing for about an hour Wearing pulse ox and HR 148-164 Confirmed taking Xarelto, Carvedilol 6.25 mg twice a day, HCTZ 12.5 mg daily. Has Metoprolol but has not used in over a year and does not have with her Blood pressure 160/100 irregular  Has PAF  Will discuss with Dr Royann Shivers

## 2021-04-08 NOTE — Telephone Encounter (Signed)
Discussed with Dr Royann Shivers, he suggested taking another Carvedilol rest/take it easy and hopefully will convert on on. Advised if gets worse, develops symptoms to go to ED, if no improvement call back Patient verbalized understanding

## 2021-04-08 NOTE — Telephone Encounter (Signed)
Patient c/o Palpitations:  High priority if patient c/o lightheadedness, shortness of breath, or chest pain  How long have you had palpitations/irregular HR/ Afib? Are you having the symptoms now? Palpitations about an hour  Are you currently experiencing lightheadedness, SOB or CP? NO.  Had chest pressure on her chest last night  Do you have a history of afib (atrial fibrillation) or irregular heart rhythm? HX of A-FIB  Have you checked your BP or HR? (document readings if available): 155  Are you experiencing any other symptoms? Rapid HR.

## 2021-04-19 ENCOUNTER — Other Ambulatory Visit: Payer: Self-pay | Admitting: Cardiovascular Disease

## 2021-04-21 NOTE — Telephone Encounter (Signed)
Prescription refill request for Xarelto received.  Indication:Afib Last office visit:3/22 Weight:106 kg Age:56 [id number] Labs CrCl:Needs Labs  Prescription refilled

## 2021-05-01 ENCOUNTER — Other Ambulatory Visit: Payer: Self-pay | Admitting: Cardiovascular Disease

## 2021-05-02 NOTE — Telephone Encounter (Signed)
Prescription refill request for Xarelto received.  °Indication:Afib °Last office visit:3/22 °Weight:106 kg °Age:55 °Scr:Needs Labs °CrCl:Needs Labs ° °Prescription refilled ° °

## 2021-07-29 ENCOUNTER — Other Ambulatory Visit: Payer: Self-pay | Admitting: Cardiovascular Disease

## 2021-08-15 ENCOUNTER — Other Ambulatory Visit: Payer: Self-pay | Admitting: Cardiovascular Disease

## 2021-08-15 DIAGNOSIS — Z7901 Long term (current) use of anticoagulants: Secondary | ICD-10-CM

## 2021-08-15 DIAGNOSIS — I480 Paroxysmal atrial fibrillation: Secondary | ICD-10-CM

## 2021-08-19 NOTE — Telephone Encounter (Signed)
Prescription refill request for Xarelto received.  Indication: a fib Last office visit: Overdue Weight: 233 Age: 56

## 2021-10-29 ENCOUNTER — Ambulatory Visit: Payer: BC Managed Care – PPO | Admitting: Cardiovascular Disease

## 2022-01-31 ENCOUNTER — Other Ambulatory Visit: Payer: Self-pay

## 2022-01-31 ENCOUNTER — Encounter (HOSPITAL_COMMUNITY): Payer: Self-pay

## 2022-01-31 ENCOUNTER — Emergency Department (HOSPITAL_COMMUNITY)
Admission: EM | Admit: 2022-01-31 | Discharge: 2022-01-31 | Discharge status: Against Medical Advice | Payer: 59 | Attending: Physician Assistant | Admitting: Physician Assistant

## 2022-01-31 ENCOUNTER — Emergency Department (HOSPITAL_COMMUNITY): Payer: 59

## 2022-01-31 DIAGNOSIS — I4891 Unspecified atrial fibrillation: Secondary | ICD-10-CM | POA: Insufficient documentation

## 2022-01-31 DIAGNOSIS — Z7901 Long term (current) use of anticoagulants: Secondary | ICD-10-CM | POA: Diagnosis not present

## 2022-01-31 DIAGNOSIS — R0602 Shortness of breath: Secondary | ICD-10-CM | POA: Diagnosis not present

## 2022-01-31 DIAGNOSIS — Z5321 Procedure and treatment not carried out due to patient leaving prior to being seen by health care provider: Secondary | ICD-10-CM | POA: Insufficient documentation

## 2022-01-31 DIAGNOSIS — R079 Chest pain, unspecified: Secondary | ICD-10-CM | POA: Insufficient documentation

## 2022-01-31 LAB — CBC WITH DIFFERENTIAL/PLATELET
Abs Immature Granulocytes: 0.03 10*3/uL (ref 0.00–0.07)
Basophils Absolute: 0 10*3/uL (ref 0.0–0.1)
Basophils Relative: 1 %
Eosinophils Absolute: 0.1 10*3/uL (ref 0.0–0.5)
Eosinophils Relative: 2 %
HCT: 32.4 % — ABNORMAL LOW (ref 36.0–46.0)
Hemoglobin: 10.6 g/dL — ABNORMAL LOW (ref 12.0–15.0)
Immature Granulocytes: 0 %
Lymphocytes Relative: 29 %
Lymphs Abs: 2 10*3/uL (ref 0.7–4.0)
MCH: 29.4 pg (ref 26.0–34.0)
MCHC: 32.7 g/dL (ref 30.0–36.0)
MCV: 90 fL (ref 80.0–100.0)
Monocytes Absolute: 0.8 10*3/uL (ref 0.1–1.0)
Monocytes Relative: 11 %
Neutro Abs: 3.9 10*3/uL (ref 1.7–7.7)
Neutrophils Relative %: 57 %
Platelets: 278 10*3/uL (ref 150–400)
RBC: 3.6 MIL/uL — ABNORMAL LOW (ref 3.87–5.11)
RDW: 13.2 % (ref 11.5–15.5)
WBC: 6.8 10*3/uL (ref 4.0–10.5)
nRBC: 0 % (ref 0.0–0.2)

## 2022-01-31 LAB — BASIC METABOLIC PANEL
Anion gap: 11 (ref 5–15)
BUN: 12 mg/dL (ref 6–20)
CO2: 28 mmol/L (ref 22–32)
Calcium: 9.1 mg/dL (ref 8.9–10.3)
Chloride: 105 mmol/L (ref 98–111)
Creatinine, Ser: 0.81 mg/dL (ref 0.44–1.00)
GFR, Estimated: >60 mL/min (ref >60)
Glucose, Bld: 85 mg/dL (ref 70–99)
Potassium: 3.6 mmol/L (ref 3.5–5.1)
Sodium: 144 mmol/L (ref 135–145)

## 2022-01-31 LAB — BRAIN NATRIURETIC PEPTIDE: B Natriuretic Peptide: 113.3 pg/mL — ABNORMAL HIGH (ref 0.0–100.0)

## 2022-01-31 LAB — TROPONIN I (HIGH SENSITIVITY)
Troponin I (High Sensitivity): 15 ng/L (ref <18)
Troponin I (High Sensitivity): 14 ng/L (ref <18)

## 2022-01-31 NOTE — ED Notes (Signed)
Pt states that they are leaving  °

## 2022-01-31 NOTE — ED Provider Triage Note (Signed)
Emergency Medicine Provider Triage Evaluation Note  Tina Robinson , a 56 y.o. female  was evaluated in triage.  Pt complains of CP, SOB. Had episode of afib earlier today, chronically anticoagulated. Recent cough x few days. Dx COVID 3 weeks ago. Chronic BIL leg pain.  Review of Systems  Positive: CP, SOB, cough Negative:   Physical Exam  BP (!) 169/87   Pulse (!) 104   Temp 98.3 F (36.8 C) (Oral)   Resp 17   Ht 5' 7.5" (1.715 m)   Wt 104.3 kg   LMP 01/12/2012   SpO2 100%   BMI 35.49 kg/m  Gen:   Awake, no distress   Resp:  Normal effort  MSK:   Moves extremities without difficulty  Other:    Medical Decision Making  Medically screening exam initiated at 2:43 PM.  Appropriate orders placed.  Tamra Koos was informed that the remainder of the evaluation will be completed by another provider, this initial triage assessment does not replace that evaluation, and the importance of remaining in the ED until their evaluation is complete.  CP, Sob, cough   Shaquna Geigle A, PA-C 01/31/22 1444

## 2022-01-31 NOTE — ED Triage Notes (Signed)
Pt arrived POV from home c/o left sided CP that radiates into her left arm. Pt denies any N/V. Pt does endorses SHOB and a cough. Pt states she tested positive for COVID 3 weeks ago. Pt also states she has a hx of a-fib.

## 2022-02-24 ENCOUNTER — Other Ambulatory Visit: Payer: Self-pay | Admitting: Cardiovascular Disease

## 2022-03-12 ENCOUNTER — Other Ambulatory Visit: Payer: Self-pay

## 2022-03-12 ENCOUNTER — Emergency Department (HOSPITAL_BASED_OUTPATIENT_CLINIC_OR_DEPARTMENT_OTHER)
Admission: EM | Admit: 2022-03-12 | Discharge: 2022-03-12 | Disposition: A | Discharge status: Home / Self Care | Payer: 59 | Attending: Emergency Medicine | Admitting: Emergency Medicine

## 2022-03-12 ENCOUNTER — Encounter (HOSPITAL_BASED_OUTPATIENT_CLINIC_OR_DEPARTMENT_OTHER): Payer: Self-pay

## 2022-03-12 DIAGNOSIS — R197 Diarrhea, unspecified: Secondary | ICD-10-CM | POA: Diagnosis not present

## 2022-03-12 DIAGNOSIS — M791 Myalgia, unspecified site: Secondary | ICD-10-CM | POA: Diagnosis not present

## 2022-03-12 DIAGNOSIS — I10 Essential (primary) hypertension: Secondary | ICD-10-CM | POA: Diagnosis not present

## 2022-03-12 DIAGNOSIS — Z7901 Long term (current) use of anticoagulants: Secondary | ICD-10-CM | POA: Insufficient documentation

## 2022-03-12 DIAGNOSIS — Z1152 Encounter for screening for COVID-19: Secondary | ICD-10-CM | POA: Diagnosis not present

## 2022-03-12 DIAGNOSIS — R059 Cough, unspecified: Secondary | ICD-10-CM | POA: Insufficient documentation

## 2022-03-12 DIAGNOSIS — R112 Nausea with vomiting, unspecified: Secondary | ICD-10-CM | POA: Insufficient documentation

## 2022-03-12 DIAGNOSIS — R5381 Other malaise: Secondary | ICD-10-CM | POA: Diagnosis not present

## 2022-03-12 DIAGNOSIS — J101 Influenza due to other identified influenza virus with other respiratory manifestations: Secondary | ICD-10-CM

## 2022-03-12 DIAGNOSIS — Z79899 Other long term (current) drug therapy: Secondary | ICD-10-CM | POA: Diagnosis not present

## 2022-03-12 HISTORY — DX: Unspecified atrial fibrillation: I48.91

## 2022-03-12 LAB — COMPREHENSIVE METABOLIC PANEL
ALT: 20 U/L (ref 0–44)
AST: 42 U/L — ABNORMAL HIGH (ref 15–41)
Albumin: 4.1 g/dL (ref 3.5–5.0)
Alkaline Phosphatase: 51 U/L (ref 38–126)
Anion gap: 10 (ref 5–15)
BUN: 20 mg/dL (ref 6–20)
CO2: 27 mmol/L (ref 22–32)
Calcium: 8.6 mg/dL — ABNORMAL LOW (ref 8.9–10.3)
Chloride: 100 mmol/L (ref 98–111)
Creatinine, Ser: 0.88 mg/dL (ref 0.44–1.00)
GFR, Estimated: >60 mL/min (ref >60)
Glucose, Bld: 185 mg/dL — ABNORMAL HIGH (ref 70–99)
Potassium: 3.1 mmol/L — ABNORMAL LOW (ref 3.5–5.1)
Sodium: 137 mmol/L (ref 135–145)
Total Bilirubin: 0.9 mg/dL (ref 0.3–1.2)
Total Protein: 7.8 g/dL (ref 6.5–8.1)

## 2022-03-12 LAB — CBC
HCT: 37.8 % (ref 36.0–46.0)
Hemoglobin: 12 g/dL (ref 12.0–15.0)
MCH: 27.8 pg (ref 26.0–34.0)
MCHC: 31.7 g/dL (ref 30.0–36.0)
MCV: 87.5 fL (ref 80.0–100.0)
Platelets: 222 10*3/uL (ref 150–400)
RBC: 4.32 MIL/uL (ref 3.87–5.11)
RDW: 13.2 % (ref 11.5–15.5)
WBC: 7.3 10*3/uL (ref 4.0–10.5)
nRBC: 0 % (ref 0.0–0.2)

## 2022-03-12 LAB — RESP PANEL BY RT-PCR (RSV, FLU A&B, COVID)  RVPGX2
Influenza A by PCR: POSITIVE — AB
Influenza B by PCR: NEGATIVE
Resp Syncytial Virus by PCR: NEGATIVE
SARS Coronavirus 2 by RT PCR: NEGATIVE

## 2022-03-12 LAB — LIPASE, BLOOD: Lipase: 37 U/L (ref 11–51)

## 2022-03-12 MED ORDER — KETOROLAC TROMETHAMINE 30 MG/ML IJ SOLN
30.0000 mg | Freq: Once | INTRAMUSCULAR | Status: AC
  Administered 2022-03-12: 30 mg via INTRAVENOUS
  Filled 2022-03-12: qty 1

## 2022-03-12 MED ORDER — OSELTAMIVIR PHOSPHATE 75 MG PO CAPS: 75.0000 mg | ORAL_CAPSULE | Freq: Two times a day (BID) | ORAL | 0 refills | Status: AC

## 2022-03-12 MED ORDER — SODIUM CHLORIDE 0.9 % IV BOLUS
1000.0000 mL | Freq: Once | INTRAVENOUS | Status: AC
  Administered 2022-03-12: 1000 mL via INTRAVENOUS

## 2022-03-12 MED ORDER — ONDANSETRON 8 MG PO TBDP: ORAL_TABLET | ORAL | 0 refills | Status: AC

## 2022-03-12 MED ORDER — ONDANSETRON HCL 4 MG/2ML IJ SOLN
4.0000 mg | Freq: Once | INTRAMUSCULAR | Status: AC
  Administered 2022-03-12: 4 mg via INTRAVENOUS
  Filled 2022-03-12: qty 2

## 2022-03-12 NOTE — ED Notes (Addendum)
Pt agreeable with d/c plan as discussed by provider- this nurse has verbally reinforced d/c instructions and provided pt with written copy.  Pt acknowledges verbal understanding and denies any addl questions, concerns, needs.  When this nurse was preparing to step out of room to allow time for patient to get dressed pt then requested to "lie down for about 5 minutes"; reports dizzy episode.  Dr. Judd Lien notified of c/o dizzy episode; no new orders

## 2022-03-12 NOTE — ED Provider Notes (Signed)
[facility] EMERGENCY DEPT Provider Note   CSN: [id number] Arrival date & time: 03/12/22  0208     History  Chief Complaint  Patient presents with   Emesis    Younique Casad is a 56 y.o. female.  Patient is a 56 year old female with past medical history of atrial fibrillation on Xarelto, hypertension.  Patient presenting today with complaints of bodyaches, cough, diarrhea, generalized malaise, and vomiting for the past 1-1/2 days.  She tells me her granddaughter who lives with her was recently diagnosed with influenza.  She denies any shortness of breath.  She denies any aggravating or alleviating factors.  The history is provided by the patient.       Home Medications Prior to Admission medications   Medication Sig Start Date End Date Taking? Authorizing Provider  carvedilol (COREG) 6.25 MG tablet TAKE 1 TABLET BY MOUTH TWICE A DAY 07/29/21   Croitoru, Mihai, MD  Cholecalciferol (VITAMIN D) 50 MCG (2000 UT) tablet Take 2,000 Units by mouth daily.    [provider]  hydrochlorothiazide (MICROZIDE) 12.5 MG capsule TAKE 1 CAPSULE BY MOUTH EVERY DAY 02/25/22   Croitoru, Rachelle Hora, MD  meclizine (ANTIVERT) 25 MG tablet Take 1 tablet (25 mg total) by mouth 3 (three) times daily as needed for dizziness. Patient not taking: Reported on 06/20/2020 10/10/18   Dione Booze, MD  metoprolol tartrate (LOPRESSOR) 25 MG tablet TAKE 1 TABLET (25 MG TOTAL) BY MOUTH DAILY AS NEEDED. Patient not taking: Reported on 06/20/2020 06/02/17 12/05/18  Croitoru, Mihai, MD  Multiple Vitamin (MULTIVITAMIN) tablet Take 1 tablet by mouth daily.    [provider]  rivaroxaban (XARELTO) 20 MG TABS tablet TAKE 1 TABLET BY MOUTH DAILY WITH SUPPER 08/19/21   Croitoru, Mihai, MD  diltiazem (CARDIZEM CD) 180 MG 24 hr capsule TAKE ONE CAPSULE EVERY DAY Patient not taking: No sig reported 11/04/17 10/10/18  Croitoru, Mihai, MD      Allergies    Mushroom extract complex    Review of Systems    Review of Systems  All other systems reviewed and are negative.   Physical Exam Updated Vital Signs BP (!) 129/105   Pulse (!) 118   Temp 97.7 F (36.5 C) (Oral)   Resp 16   Ht 5\' 8"  (1.727 m)   Wt 104.3 kg   LMP 01/12/2012   SpO2 100%   BMI 34.97 kg/m  Physical Exam Vitals and nursing note reviewed.  Constitutional:      General: She is not in acute distress.    Appearance: She is well-developed. She is not diaphoretic.  HENT:     Head: Normocephalic and atraumatic.  Cardiovascular:     Rate and Rhythm: Normal rate and regular rhythm.     Heart sounds: No murmur heard.    No friction rub. No gallop.  Pulmonary:     Effort: Pulmonary effort is normal. No respiratory distress.     Breath sounds: Normal breath sounds. No wheezing.  Abdominal:     General: Bowel sounds are normal. There is no distension.     Palpations: Abdomen is soft.     Tenderness: There is no abdominal tenderness.  Musculoskeletal:        General: Normal range of motion.     Cervical back: Normal range of motion and neck supple.  Skin:    General: Skin is warm and dry.  Neurological:     General: No focal deficit present.     Mental Status: She is alert  and oriented to person, place, and time.     ED Results / Procedures / Treatments   Labs (all labs ordered are listed, but only abnormal results are displayed) Labs Reviewed  RESP PANEL BY RT-PCR (RSV, FLU A&B, COVID)  RVPGX2 - Abnormal; Notable for the following components:      Result Value   Influenza A by PCR POSITIVE (*)    All other components within normal limits  COMPREHENSIVE METABOLIC PANEL - Abnormal; Notable for the following components:   Potassium 3.1 (*)    Glucose, Bld 185 (*)    Calcium 8.6 (*)    AST 42 (*)    All other components within normal limits  LIPASE, BLOOD  CBC  URINALYSIS, ROUTINE W REFLEX MICROSCOPIC    EKG None  Radiology No results found.  Procedures Procedures    Medications Ordered in  ED Medications  sodium chloride 0.9 % bolus 1,000 mL (has no administration in time range)  ondansetron (ZOFRAN) injection 4 mg (has no administration in time range)  ketorolac (TORADOL) 30 MG/ML injection 30 mg (has no administration in time range)    ED Course/ Medical Decision Making/ A&P  Patient is a 56 year old female with past medical history as per HPI presenting with complaints of bodyaches, nausea, vomiting for the past 1-1/2 days.  She was exposed to influenza by her granddaughter with whom she lives.  Patient arrives here with stable vital signs and is afebrile.  There is no hypoxia.  On exam, heart is regular rate and rhythm.  Lungs are clear.  Abdomen is benign.  Remainder of physical examination unremarkable.  Workup initiated including CBC, metabolic panel, and nasal swab.  Laboratory studies unremarkable except for influenza A testing positive.  Patient was given normal saline along with medication for nausea.  She seems to be feeling better and I believe can safely be discharged.  She will be given Tamiflu and Zofran and is to follow-up as needed.  Final Clinical Impression(s) / ED Diagnoses Final diagnoses:  None    Rx / DC Orders ED Discharge Orders     None         Geoffery Lyons, MD 03/12/22 [phone]

## 2022-03-12 NOTE — ED Triage Notes (Addendum)
Pt arrives from home via ems -- cc: n/v/d/malaise x "last three days" - known sick contact with granddaughter diagnosed with flu recently.  EMS reports pt clammy and diaphorectic; BGL 189 enroute.  EKG - SB

## 2022-03-12 NOTE — Discharge Instructions (Addendum)
Begin taking Tamiflu as prescribed.  Begin taking Zofran as prescribed as needed for nausea.  Clear liquid diet for the next 12 hours, then slowly advance to normal as tolerated.  Take Tylenol 1000 mg rotated with ibuprofen 600 mg every 4 hours as needed for fever.  Follow-up with primary doctor if not improving in the next few days.

## 2022-03-12 NOTE — ED Notes (Signed)
Pt reporting feeling better after lying down - able to sit up and stand without recurrent dizziness; escorted to ED lobby via w/c to await ride home.

## 2022-06-03 ENCOUNTER — Other Ambulatory Visit: Payer: Self-pay | Admitting: Cardiovascular Disease

## 2022-08-21 ENCOUNTER — Other Ambulatory Visit: Payer: Self-pay

## 2022-08-21 ENCOUNTER — Encounter (HOSPITAL_COMMUNITY): Payer: Self-pay

## 2022-08-21 ENCOUNTER — Emergency Department (HOSPITAL_COMMUNITY)
Admission: EM | Admit: 2022-08-21 | Discharge: 2022-08-21 | Disposition: A | Discharge status: Home / Self Care | Payer: 59 | Attending: Emergency Medicine | Admitting: Emergency Medicine

## 2022-08-21 DIAGNOSIS — S025XXA Fracture of tooth (traumatic), initial encounter for closed fracture: Secondary | ICD-10-CM | POA: Insufficient documentation

## 2022-08-21 DIAGNOSIS — X58XXXA Exposure to other specified factors, initial encounter: Secondary | ICD-10-CM | POA: Insufficient documentation

## 2022-08-21 DIAGNOSIS — T180XXA Foreign body in mouth, initial encounter: Secondary | ICD-10-CM | POA: Diagnosis not present

## 2022-08-21 DIAGNOSIS — S0993XA Unspecified injury of face, initial encounter: Secondary | ICD-10-CM | POA: Diagnosis present

## 2022-08-21 MED ORDER — PENICILLIN V POTASSIUM 500 MG PO TABS: 500.0000 mg | ORAL_TABLET | Freq: Four times a day (QID) | ORAL | 0 refills | Status: AC

## 2022-08-21 MED ORDER — KETOROLAC TROMETHAMINE 15 MG/ML IJ SOLN
30.0000 mg | Freq: Once | INTRAMUSCULAR | Status: AC
  Administered 2022-08-21: 30 mg via INTRAMUSCULAR
  Filled 2022-08-21: qty 2

## 2022-08-21 NOTE — ED Provider Notes (Signed)
[place] EMERGENCY DEPARTMENT AT [facility] Provider Note   CSN: [id number] Arrival date & time: 08/21/22  1830     History Chief Complaint  Patient presents with   Foreign Body in Mouth    Bracket of braces broken and penetrating skin in mouth    HPI Tina Robinson is a 57 y.o. female presenting for foreign body in mouth.  She had a palate expander that had a piece of food underneath and when she was trying to remove it it broke off of her tooth.  States that she had a "panic attack" and started trying to pull it out of her mouth.  Denies fevers chills nausea vomiting syncope shortness of breath..   Patient's recorded medical, surgical, social, medication list and allergies were reviewed in the Snapshot window as part of the initial history.   Review of Systems   Review of Systems  Constitutional:  Negative for chills and fever.  HENT:  Positive for dental problem. Negative for drooling, ear pain and sore throat.   Eyes:  Negative for pain and visual disturbance.  Respiratory:  Negative for cough and shortness of breath.   Cardiovascular:  Negative for chest pain and palpitations.  Gastrointestinal:  Negative for abdominal pain and vomiting.  Genitourinary:  Negative for dysuria and hematuria.  Musculoskeletal:  Negative for arthralgias and back pain.  Skin:  Negative for color change and rash.  Neurological:  Negative for seizures and syncope.  All other systems reviewed and are negative.   Physical Exam Updated Vital Signs BP (!) 156/103 (BP Location: Right Arm)   Pulse 64   Temp 98 F (36.7 C) (Oral)   Resp 18   Ht 5\' 8"  (1.727 m)   Wt 104.3 kg   LMP 01/12/2012   SpO2 96%   BMI 34.97 kg/m  Physical Exam Vitals and nursing note reviewed.  Constitutional:      General: She is not in acute distress.    Appearance: She is well-developed.  HENT:     Head: Normocephalic and atraumatic.     Mouth/Throat:     Comments: 1 fractured tooth with no visible  pulp. Extensive dental disease and periodontal disease. Large metal bracket hanging from right back molar. Eyes:     Conjunctiva/sclera: Conjunctivae normal.  Cardiovascular:     Rate and Rhythm: Normal rate and regular rhythm.     Heart sounds: No murmur heard. Pulmonary:     Effort: Pulmonary effort is normal. No respiratory distress.     Breath sounds: Normal breath sounds.  Abdominal:     General: There is no distension.     Palpations: Abdomen is soft.     Tenderness: There is no abdominal tenderness. There is no right CVA tenderness or left CVA tenderness.  Musculoskeletal:        General: No swelling or tenderness. Normal range of motion.     Cervical back: Neck supple.  Skin:    General: Skin is warm and dry.  Neurological:     General: No focal deficit present.     Mental Status: She is alert and oriented to person, place, and time. Mental status is at baseline.     Cranial Nerves: No cranial nerve deficit.      ED Course/ Medical Decision Making/ A&P    Procedures .Foreign Body Removal  Date/Time: 08/21/2022 10:52 PM  Performed by: Glyn Ade, MD Authorized by: Glyn Ade, MD  Consent: Verbal consent obtained. Body area: throat Removal mechanism:  forceps Complexity: complex Post-procedure assessment: foreign body removed Patient tolerance: patient tolerated the procedure well with no immediate complications     Medications Ordered in ED Medications  ketorolac (TORADOL) 15 MG/ML injection 30 mg (30 mg Intramuscular Given 08/21/22 2008)    Medical Decision Making:   Very difficult foreign body extraction using metal pliers.  There were no sterile tools and patient consented to using standard tools that were sterilized with isopropyl alcohol. Will place on penicillin for possible pulpitis given localized trauma.  Recommended very close follow-up with dentistry within 48 hours as well as with her orthopedic provider.  Gauze to cover spare wire.   Patient ambulatory tolerating p.o. intake following procedure in no acute distress.  Mechanical soft diet only. Clinical Impression:  1. Closed fracture of tooth, initial encounter      Discharge   Final Clinical Impression(s) / ED Diagnoses Final diagnoses:  Closed fracture of tooth, initial encounter    Rx / DC Orders ED Discharge Orders          Ordered    penicillin v potassium (VEETID) 500 MG tablet  4 times daily        08/21/22 2112              Glyn Ade, MD 08/21/22 2252

## 2022-08-21 NOTE — ED Triage Notes (Signed)
Pt arrived to ED via POV w/ c/o chocolate having gotten stuck under the expander on her braces. Pt tried to remove the chocolate and ended up breaking the expander off the left side and then breaking her back molar on the R side along w/ the bracket around the tooth and now the expander is stuck in her mouth.

## 2022-10-05 ENCOUNTER — Emergency Department (HOSPITAL_COMMUNITY): Payer: 59

## 2022-10-05 ENCOUNTER — Encounter (HOSPITAL_COMMUNITY): Payer: Self-pay

## 2022-10-05 ENCOUNTER — Other Ambulatory Visit: Payer: Self-pay

## 2022-10-05 ENCOUNTER — Emergency Department (HOSPITAL_COMMUNITY)
Admission: EM | Admit: 2022-10-05 | Discharge: 2022-10-05 | Disposition: A | Discharge status: Home / Self Care | Payer: 59 | Attending: Emergency Medicine | Admitting: Emergency Medicine

## 2022-10-05 DIAGNOSIS — R079 Chest pain, unspecified: Secondary | ICD-10-CM | POA: Diagnosis not present

## 2022-10-05 DIAGNOSIS — M25512 Pain in left shoulder: Secondary | ICD-10-CM | POA: Diagnosis not present

## 2022-10-05 DIAGNOSIS — Z79899 Other long term (current) drug therapy: Secondary | ICD-10-CM | POA: Diagnosis not present

## 2022-10-05 DIAGNOSIS — R7989 Other specified abnormal findings of blood chemistry: Secondary | ICD-10-CM | POA: Insufficient documentation

## 2022-10-05 DIAGNOSIS — I10 Essential (primary) hypertension: Secondary | ICD-10-CM | POA: Diagnosis not present

## 2022-10-05 LAB — HEPATIC FUNCTION PANEL
ALT: 15 U/L (ref 0–44)
AST: 28 U/L (ref 15–41)
Albumin: 4 g/dL (ref 3.5–5.0)
Alkaline Phosphatase: 70 U/L (ref 38–126)
Bilirubin, Direct: 0.2 mg/dL (ref 0.0–0.2)
Indirect Bilirubin: 0.5 mg/dL (ref 0.3–0.9)
Total Bilirubin: 0.7 mg/dL (ref 0.3–1.2)
Total Protein: 8.1 g/dL (ref 6.5–8.1)

## 2022-10-05 LAB — BASIC METABOLIC PANEL
Anion gap: 7 (ref 5–15)
BUN: 16 mg/dL (ref 6–20)
CO2: 26 mmol/L (ref 22–32)
Calcium: 8.8 mg/dL — ABNORMAL LOW (ref 8.9–10.3)
Chloride: 103 mmol/L (ref 98–111)
Creatinine, Ser: 0.97 mg/dL (ref 0.44–1.00)
GFR, Estimated: >60 mL/min (ref >60)
Glucose, Bld: 100 mg/dL — ABNORMAL HIGH (ref 70–99)
Potassium: 4 mmol/L (ref 3.5–5.1)
Sodium: 136 mmol/L (ref 135–145)

## 2022-10-05 LAB — TROPONIN I (HIGH SENSITIVITY)
Troponin I (High Sensitivity): 5 ng/L (ref <18)
Troponin I (High Sensitivity): 4 ng/L (ref <18)

## 2022-10-05 LAB — CBC
HCT: 37 % (ref 36.0–46.0)
Hemoglobin: 12 g/dL (ref 12.0–15.0)
MCH: 28.6 pg (ref 26.0–34.0)
MCHC: 32.4 g/dL (ref 30.0–36.0)
MCV: 88.3 fL (ref 80.0–100.0)
Platelets: 316 10*3/uL (ref 150–400)
RBC: 4.19 MIL/uL (ref 3.87–5.11)
RDW: 12.8 % (ref 11.5–15.5)
WBC: 6.6 10*3/uL (ref 4.0–10.5)
nRBC: 0 % (ref 0.0–0.2)

## 2022-10-05 LAB — D-DIMER, QUANTITATIVE: D-Dimer, Quant: 0.64 ug/mL-FEU — ABNORMAL HIGH (ref 0.00–0.50)

## 2022-10-05 LAB — BRAIN NATRIURETIC PEPTIDE: B Natriuretic Peptide: 66.1 pg/mL (ref 0.0–100.0)

## 2022-10-05 LAB — MAGNESIUM: Magnesium: 2.2 mg/dL (ref 1.7–2.4)

## 2022-10-05 MED ORDER — IOHEXOL 350 MG/ML SOLN
75.0000 mL | Freq: Once | INTRAVENOUS | Status: AC | PRN
  Administered 2022-10-05: 75 mL via INTRAVENOUS

## 2022-10-05 MED ORDER — LORAZEPAM 2 MG/ML IJ SOLN
0.5000 mg | Freq: Once | INTRAMUSCULAR | Status: AC
  Administered 2022-10-05: 0.5 mg via INTRAVENOUS
  Filled 2022-10-05: qty 1

## 2022-10-05 NOTE — ED Provider Notes (Signed)
[place] EMERGENCY DEPARTMENT AT [facility] Provider Note   CSN: [id number] Arrival date & time: 10/05/22  1047     History  Chief Complaint  Patient presents with   Chest Pain    Tina Robinson is a 57 y.o. female.  Patient is a 57 year old female with a past medical history of A-fib not on AC and hypertension presenting to the emergency department with left shoulder pain and chest pain.  The patient states for the last 2 weeks she has had pain in her left shoulder that will occasionally radiate into the left side of her neck and down to her left elbow.  She denies any trauma or falls but does report about a month ago she was doing heavy lifting at work and may have strained her shoulder then.  She denies any associated numbness or weakness.  She reports for the last couple of days she has had intermittent chest pain that come and go.  They are not associated with exertion.  She states that she is also had some shortness of breath especially when she is bending over.  She states that she has had some mild swelling in her feet.  She states that she did recently travel to [place] that was an 8 to 9-hour car drive but did take breaks in between.  Patient was seen by her cardiologist this morning who recommended that she come to the emergency department for further evaluation.  The history is provided by the patient.  Chest Pain      Home Medications Prior to Admission medications   Medication Sig Start Date End Date Taking? Authorizing Provider  carvedilol (COREG) 6.25 MG tablet TAKE 1 TABLET BY MOUTH TWICE A DAY 07/29/21   Croitoru, Mihai, MD  Cholecalciferol (VITAMIN D) 50 MCG (2000 UT) tablet Take 2,000 Units by mouth daily.    [provider]  hydrochlorothiazide (MICROZIDE) 12.5 MG capsule TAKE 1 CAPSULE BY MOUTH EVERY DAY 06/04/22   Croitoru, Rachelle Hora, MD  meclizine (ANTIVERT) 25 MG tablet Take 1 tablet (25 mg total) by mouth 3 (three) times daily as needed for  dizziness. Patient not taking: Reported on 06/20/2020 10/10/18   Dione Booze, MD  metoprolol tartrate (LOPRESSOR) 25 MG tablet TAKE 1 TABLET (25 MG TOTAL) BY MOUTH DAILY AS NEEDED. Patient not taking: Reported on 06/20/2020 06/02/17 12/05/18  Croitoru, Mihai, MD  Multiple Vitamin (MULTIVITAMIN) tablet Take 1 tablet by mouth daily.    [provider]  ondansetron (ZOFRAN-ODT) 8 MG disintegrating tablet 8mg  ODT q4 hours prn nausea 03/12/22   Geoffery Lyons, MD  oseltamivir (TAMIFLU) 75 MG capsule Take 1 capsule (75 mg total) by mouth every 12 (twelve) hours. 03/12/22   Geoffery Lyons, MD  rivaroxaban (XARELTO) 20 MG TABS tablet TAKE 1 TABLET BY MOUTH DAILY WITH SUPPER 08/19/21   Croitoru, Mihai, MD  diltiazem (CARDIZEM CD) 180 MG 24 hr capsule TAKE ONE CAPSULE EVERY DAY Patient not taking: No sig reported 11/04/17 10/10/18  Croitoru, Mihai, MD      Allergies    Mushroom extract complex    Review of Systems   Review of Systems  Cardiovascular:  Positive for chest pain.    Physical Exam Updated Vital Signs BP 129/71   Pulse 89   Temp 98.3 F (36.8 C)   Resp 16   Ht 5\' 7"  (1.702 m)   Wt 104.3 kg   LMP 01/12/2012   SpO2 97%   BMI 36.02 kg/m  Physical Exam Vitals and nursing  note reviewed.  Constitutional:      General: She is not in acute distress.    Appearance: She is well-developed.  HENT:     Head: Normocephalic and atraumatic.  Eyes:     Extraocular Movements: Extraocular movements intact.  Cardiovascular:     Rate and Rhythm: Normal rate and regular rhythm.     Pulses:          Radial pulses are 2+ on the right side and 2+ on the left side.     Heart sounds: Normal heart sounds.  Pulmonary:     Effort: Pulmonary effort is normal.     Breath sounds: Normal breath sounds.  Abdominal:     Palpations: Abdomen is soft.     Tenderness: There is no abdominal tenderness.  Musculoskeletal:        General: Normal range of motion.     Cervical back: Normal range of motion  and neck supple.     Right lower leg: No tenderness. No edema.     Left lower leg: No tenderness. No edema.     Comments: Tenderness to palpation of left posterior trapezius muscle  Skin:    General: Skin is warm and dry.  Neurological:     General: No focal deficit present.     Mental Status: She is alert and oriented to person, place, and time.  Psychiatric:        Mood and Affect: Mood normal.        Behavior: Behavior normal.     ED Results / Procedures / Treatments   Labs (all labs ordered are listed, but only abnormal results are displayed) Labs Reviewed  BASIC METABOLIC PANEL - Abnormal; Notable for the following components:      Result Value   Glucose, Bld 100 (*)    Calcium 8.8 (*)    All other components within normal limits  D-DIMER, QUANTITATIVE (NOT AT [facility]) - Abnormal; Notable for the following components:   D-Dimer, Quant 0.64 (*)    All other components within normal limits  CBC  BRAIN NATRIURETIC PEPTIDE  HEPATIC FUNCTION PANEL  MAGNESIUM  TSH  TROPONIN I (HIGH SENSITIVITY)  TROPONIN I (HIGH SENSITIVITY)    EKG EKG Interpretation Date/Time:  Monday October 05 2022 10:53:59 EDT Ventricular Rate:  69 PR Interval:  159 QRS Duration:  103 QT Interval:  417 QTC Calculation: 447 R Axis:   10  Text Interpretation: Sinus rhythm No significant change since last tracing Confirmed by Elayne Snare ([id number]) on 10/05/2022 11:02:17 AM  Radiology DG Shoulder Left  Result Date: 10/05/2022 CLINICAL DATA:  Left arm pain for 2 weeks. EXAM: LEFT SHOULDER - 2+ VIEW COMPARISON:  None Available. FINDINGS: There is no acute fracture or dislocation. Bony alignment is normal. There is mild degenerative change about the [facility] joint. There is no erosive change. The soft tissues are unremarkable. IMPRESSION: Mild degenerative change about the [facility] joint.  No acute finding. Electronically Signed   By: Lesia Hausen M.D.   On: 10/05/2022 12:08   DG Chest 2 View  Result Date:  10/05/2022 CLINICAL DATA:  Provided history: Chest pain. Additional history provided: Left arm pain, dizziness, left-sided chest pain. History of hypertension atrial fibrillation. EXAM: CHEST - 2 VIEW COMPARISON:  Prior chest 01/31/2022 and earlier. FINDINGS: A loop recorder device overlies the left chest. Cardiomegaly. No appreciable airspace consolidation or pulmonary edema. No evidence of pleural effusion or pneumothorax. No acute osseous abnormality identified. Degenerative changes of the spine. IMPRESSION:  1. No evidence of an acute cardiopulmonary abnormality. 2. Cardiomegaly. Electronically Signed   By: Jackey Loge D.O.   On: 10/05/2022 12:05    Procedures Procedures    Medications Ordered in ED Medications  iohexol (OMNIPAQUE) 350 MG/ML injection 75 mL (has no administration in time range)  LORazepam (ATIVAN) injection 0.5 mg (0.5 mg Intravenous Given 10/05/22 1547)    ED Course/ Medical Decision Making/ A&P Clinical Course as of 10/05/22 1620  Mon Oct 05, 2022  1350 D-dimer mildly elevated, will have CTPE study. [VK]  [id number] Patient signed out to Dr. Rodena Medin pending CTPE with plan for discharge if negative. [VK]    Clinical Course User Index [VK] Rexford Maus, DO                             Medical Decision Making This patient presents to the ED with chief complaint(s) of chest pain, L shoulder pain with pertinent past medical history of a fib not on [facility], HTN which further complicates the presenting complaint. The complaint involves an extensive differential diagnosis and also carries with it a high risk of complications and morbidity.    The differential diagnosis includes ACS, arrhythmia, anemia, considering PE with her recent travels and not on AC, pneumonia, pneumothorax, pulmonary edema, pleural effusion, patient's arm is neurovascularly intact making neurovascular insult unlikely, considering muscle strain or spasm, fracture, no deformity making dislocation  unlikely  Additional history obtained: Additional history obtained from N/A Records reviewed Care Everywhere/External Records - outpatient cardiology records - recommending CXR, troponin, CMP, d-dimer, TSH, mag  ED Course and Reassessment: On patient's arrival she is hemodynamically stable in no acute distress.  EKG on arrival showed normal sinus rhythm without acute ischemic changes.  The patient will have labs as recommended by her cardiologist as well as chest x-ray and left shoulder x-ray to evaluate for cause of her symptoms.  She declined any pain control at this time and will be closely reassessed.  Independent labs interpretation:  The following labs were independently interpreted: mildly elevated d-dimer, otherwise within normal range  Independent visualization of imaging: - I independently visualized the following imaging with scope of interpretation limited to determining acute life threatening conditions related to emergency care: CXR, L shoulder XR, which revealed no acute disease     Amount and/or Complexity of Data Reviewed Labs: ordered. Radiology: ordered.  Risk Prescription drug management.          Final Clinical Impression(s) / ED Diagnoses Final diagnoses:  Acute pain of left shoulder  Nonspecific chest pain    Rx / DC Orders ED Discharge Orders     None         Rexford Maus, DO 10/05/22 1620

## 2022-10-05 NOTE — ED Provider Notes (Signed)
Patient seen after prior EDP.  Patient's workup in the ED is without evidence of significant acute pathology requiring additional workup and/or admission.  Patient does have established outpatient providers.  Patient plans on following up closely with her cardiologist.  Strict return precautions given and understood.   [name], MD 10/05/22 [phone]

## 2022-10-05 NOTE — Discharge Instructions (Signed)
You were seen in the emergency department for your chest pain and shoulder pain.  Your workup showed no signs of heart attack or stress on your heart and no signs of abnormality within your lung.  You likely strained a muscle in your shoulder and you can continue to take Tylenol and Motrin as needed for pain.  You can follow-up with your primary doctor to have this rechecked.  You should return to the emergency department if you have significantly worsening pain, your fingers turn numb or blue, you have severe shortness of breath or if you have any other new or concerning symptoms.

## 2022-10-05 NOTE — ED Triage Notes (Addendum)
Patient began having left arm pain for 2 weeks, dizziness for 2 days, left sided chest pain that comes and goes. Radiation. No nausea or vomiting. History of hypertension and a fib.
